# Patient Record
Sex: Male | Born: 1990
Health system: Southern US, Community
[De-identification: ages and names within clinical notes are randomized; demographics above are authoritative.]

## PROBLEM LIST (undated history)

## (undated) DIAGNOSIS — L409 Psoriasis, unspecified: Secondary | ICD-10-CM

## (undated) DIAGNOSIS — E669 Obesity, unspecified: Secondary | ICD-10-CM

## (undated) DIAGNOSIS — R569 Unspecified convulsions: Secondary | ICD-10-CM

## (undated) DIAGNOSIS — F988 Other specified behavioral and emotional disorders with onset usually occurring in childhood and adolescence: Principal | ICD-10-CM

## (undated) HISTORY — DX: Unspecified convulsions: R56.9

## (undated) HISTORY — PX: MASTOIDECTOMY: SUR855

## (undated) HISTORY — PX: ADENOIDECTOMY: SUR15

## (undated) HISTORY — DX: Other specified behavioral and emotional disorders with onset usually occurring in childhood and adolescence: F98.8

## (undated) HISTORY — PX: MYRINGOTOMY WITH TUBE PLACEMENT: SHX5663

## (undated) HISTORY — DX: Psoriasis, unspecified: L40.9

## (undated) HISTORY — DX: Obesity, unspecified: E66.9

---
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
## 2012-07-28 ENCOUNTER — Other Ambulatory Visit: Payer: Self-pay

## 2012-07-28 NOTE — Telephone Encounter (Signed)
Refill request for Concerta 54 mg

## 2012-08-03 ENCOUNTER — Telehealth: Payer: Self-pay

## 2012-08-03 DIAGNOSIS — G40309 Generalized idiopathic epilepsy and epileptic syndromes, not intractable, without status epilepticus: Secondary | ICD-10-CM

## 2012-08-03 DIAGNOSIS — G40209 Localization-related (focal) (partial) symptomatic epilepsy and epileptic syndromes with complex partial seizures, not intractable, without status epilepticus: Secondary | ICD-10-CM

## 2012-08-03 NOTE — Telephone Encounter (Signed)
Paige lvm stating that she is having difficulties getting Keppra BMN filled due to insurance purposes. I called pharmacy to speak with them about exactly what is going on. They said that the woman, Lacretia Nicks, handles this and that she would not be in until tomorrow at 11:00 am. I called mom and let her know that I tried contacting the pharmacy. She said that she is at work and has been trying to contact her insurance all day about this but is unable to stay on hold long enough to find out. She seems to think it's bc the med is BMN and wants to know if they can switch child to generic. The bigger concern is that the child is going to be out of medication tomorrow. I told her that I would have Inetta Fermo give her a call. Please call her at [phone].

## 2012-08-04 NOTE — Telephone Encounter (Signed)
I received a fax from the pharmacy with a form for prior authorization. I completed the form, marked it urgent and faxed it in. I will monitor it today and call them if an answer not received in a timely fashion. TG

## 2012-08-06 NOTE — Telephone Encounter (Signed)
I checked on the prior authorization today, which is still pending at the time of this call. Mom called today left a message today asking about the prior authorization and if he could switch to generic. I left her a message back and asked her to call me to discuss switching him to generic.

## 2012-08-09 MED ORDER — KEPPRA 500 MG PO TABS: ORAL_TABLET | ORAL | Status: DC

## 2012-08-09 NOTE — Telephone Encounter (Signed)
I received a fax today from [employer], the patient's pharmacy insurance, stating that the Keppra was approved to Aug 06, 2013 and that the pharmacy had been notified.

## 2012-08-23 ENCOUNTER — Ambulatory Visit (INDEPENDENT_AMBULATORY_CARE_PROVIDER_SITE_OTHER): Payer: PRIVATE HEALTH INSURANCE | Admitting: Pediatrics

## 2012-08-23 ENCOUNTER — Encounter: Payer: Self-pay | Admitting: Pediatrics

## 2012-08-23 VITALS — BP 132/76 | Wt 257.6 lb

## 2012-08-23 DIAGNOSIS — E669 Obesity, unspecified: Secondary | ICD-10-CM

## 2012-08-23 DIAGNOSIS — F988 Other specified behavioral and emotional disorders with onset usually occurring in childhood and adolescence: Secondary | ICD-10-CM

## 2012-08-23 MED ORDER — METHYLPHENIDATE HCL ER (OSM) 36 MG PO TBCR: 36.0000 mg | EXTENDED_RELEASE_TABLET | ORAL | Status: DC

## 2012-08-24 ENCOUNTER — Encounter: Payer: Self-pay | Admitting: Pediatrics

## 2012-08-24 DIAGNOSIS — L409 Psoriasis, unspecified: Secondary | ICD-10-CM

## 2012-08-24 DIAGNOSIS — E669 Obesity, unspecified: Secondary | ICD-10-CM

## 2012-08-24 DIAGNOSIS — R569 Unspecified convulsions: Secondary | ICD-10-CM

## 2012-08-24 DIAGNOSIS — F988 Other specified behavioral and emotional disorders with onset usually occurring in childhood and adolescence: Secondary | ICD-10-CM

## 2012-08-24 HISTORY — DX: Obesity, unspecified: E66.9

## 2012-08-24 HISTORY — DX: Other specified behavioral and emotional disorders with onset usually occurring in childhood and adolescence: F98.8

## 2012-08-24 HISTORY — DX: Unspecified convulsions: R56.9

## 2012-08-24 HISTORY — DX: Psoriasis, unspecified: L40.9

## 2012-08-24 NOTE — Progress Notes (Signed)
Patient ID: Chase Wheeler, male   DOB: Aug 20, 1990, 22 y.o.   MRN: [id number]  Subjective:     Patient ID: Chase Wheeler, male   DOB: April 13, 1990, 22 y.o.   MRN: [id number]  HPI: 22 y/o M Presents for medication recheck and review.   He is on Concerta 54 mg. He skips it on weekends and days he is off of work. However he has been out for over a month. He reports he cannot focus well off the meds. He has a part time job with [employer]. Symptoms respond well to present medication.  Medication concerns: none.   Remains effective.   He sleeps well at night. Does not know if he snores. His weight is up 5 lbs from 252.4 lbs when last seen in July, with no other SEs. There is a weight of 261 recorded at the Neuro office in Dec.  Labs done Feb 2013 showed high cholesterol of 242 total and 292 TG. Thyroid and A1C were wnl. He is adopted and does not know his biological family medical history. Denies chest pains or other cardiac symptoms. He is followed by Neuro for epilepsy. Last seizure was in May 2012. He has been on Keppra 1500 mg BID for many years.He is due to see them next month and possibly stop or decrease meds. The pt also has Psoriasis. The sun has been helping it. He saw Dermatology over 1 y ago and applies special shampoos. He does not have a f/u appointment with them.   ROS:  Apart from the symptoms reviewed above, there are no other symptoms referable to all systems reviewed.   Physical Examination  Blood pressure 132/76, weight 257 lb 9.6 oz (116.847 kg). General: Alert, NAD, overweight. Appropriate affect. HEENT: TM's - clear, Throat - clear, Neck - FROM, no meningismus, Sclera - clear LYMPH NODES: No LN noted LUNGS: CTA B CV: RRR without Murmurs ABD: Soft, NT, +BS, No HSM GU: Not Examined SKIN: Scalp shows profuse scaling. Elbows with mild erythema and thick patches. NEUROLOGICAL: Grossly intact MUSCULOSKELETAL: Not examined  No results found. No results found for this or any  previous visit (from the past 240 hour(s)). No results found for this or any previous visit (from the past 48 hour(s)).  Assessment:   ADD: off meds for many weeks. His BP is elevated today. There are a few elevated readings of 130s/ 80s recorded randomly over the past visits.  Obesity. Seizure disorder: stable. Psoriasis: uncontrolled.  Plan:   Since he has elevated BP and has been off meds for a while I will restart Concerta at 36 mg only today. The pt will measure BPs at home and let us know the results. May need to start meds at some point. Discussed high cholesterol and healthy weight/ diet. Pt agrees to see a nutritionist. Lab requests for fasting blood levels as below. F/u with Neuro. Make a new appointment with Derm. RTC in 4 m for f/u of meds and weight.  Orders Placed This Encounter  Procedures  . Lipid Panel    Standing Status: Future     Number of Occurrences:      Standing Expiration Date: 08/23/2013    Order Specific Question:  Has the patient fasted?    Answer:  Yes  . Hemoglobin A1c    Standing Status: Future     Number of Occurrences:      Standing Expiration Date: 08/23/2013  . Basic Metabolic Panel    Standing Status: Future  Number of Occurrences:      Standing Expiration Date: 08/23/2013    Order Specific Question:  Has the patient fasted?    Answer:  Yes  . Hepatic Function Panel    Standing Status: Future     Number of Occurrences:      Standing Expiration Date: 08/23/2013  . Ambulatory referral to diabetic education    Referral Priority:  Routine    Referral Type:  Consultation    Referral Reason:  Specialty Services Required    Number of Visits Requested:  1   Current Outpatient Prescriptions  Medication Sig Dispense Refill  . KEPPRA 500 MG tablet Take 3 tabs by mouth twice daily  186 tablet  5  . methylphenidate (CONCERTA) 36 MG CR tablet Take 1 tablet (36 mg total) by mouth every morning.  30 tablet  0   No current facility-administered  medications for this visit.

## 2012-11-12 ENCOUNTER — Other Ambulatory Visit: Payer: Self-pay | Admitting: *Deleted

## 2012-11-12 DIAGNOSIS — F988 Other specified behavioral and emotional disorders with onset usually occurring in childhood and adolescence: Secondary | ICD-10-CM

## 2012-11-12 MED ORDER — METHYLPHENIDATE HCL ER (OSM) 36 MG PO TBCR: 36.0000 mg | EXTENDED_RELEASE_TABLET | ORAL | Status: DC

## 2012-11-24 ENCOUNTER — Telehealth: Payer: Self-pay | Admitting: Pediatrics

## 2012-11-24 NOTE — Telephone Encounter (Signed)
Spoke with mom in office on 8/18 when she came in to pick up Rx for Concerta. I explained to mom that our office is generally moving away from ADHD meds. Also the pt is 22 y/o and has been off meds and on again for many years. He has not had formal testing for ADHD. I offered mom to refer him out for medication management. Mom refused at this time and said she would think about it and let us know. I informed her that I will give him his last Rx today, so she must let us know soon. Mom understands.

## 2013-01-26 ENCOUNTER — Telehealth: Payer: Self-pay | Admitting: *Deleted

## 2013-01-26 NOTE — Telephone Encounter (Signed)
I spoke with mom on Aug 20th and at that time she said she would let us know what she decides. If she wants to stay with [place] then we require formal testing before deciding to continuing ADHD meds here or sending out. If she wants to transfer to another provider, then she must do so soon. Please supply 2 weeks worth of meds till mom makes a decision. He is over 18 now and will be seen by one of our adult providers, if she stays here. Policy is the same.

## 2013-01-26 NOTE — Telephone Encounter (Signed)
Mom called and left VM stating that MD has discussed with her that office is getting away from prescribing ADHD meds. She stated that pt has an upcoming appointment and that he is almost out of meds and asks if MD would give one more refill until he is seen by other MD. She stated that she understood the transition but was hoping that MD would consider giving only one additional refill. Will route to MD.

## 2013-01-27 ENCOUNTER — Other Ambulatory Visit: Payer: Self-pay | Admitting: *Deleted

## 2013-01-27 DIAGNOSIS — F988 Other specified behavioral and emotional disorders with onset usually occurring in childhood and adolescence: Secondary | ICD-10-CM

## 2013-01-27 MED ORDER — METHYLPHENIDATE HCL ER (OSM) 36 MG PO TBCR: 36.0000 mg | EXTENDED_RELEASE_TABLET | ORAL | Status: DC

## 2013-01-27 NOTE — Telephone Encounter (Signed)
Nurse called mom and informed her that pt would be provided with 2 weeks worth of medication. Mom very appreciative and stated that was all they needed.

## 2013-01-27 NOTE — Telephone Encounter (Signed)
Mom notified. See telephone encounter

## 2013-03-16 ENCOUNTER — Ambulatory Visit (INDEPENDENT_AMBULATORY_CARE_PROVIDER_SITE_OTHER): Payer: PRIVATE HEALTH INSURANCE | Admitting: Family

## 2013-03-16 ENCOUNTER — Encounter: Payer: Self-pay | Admitting: Family

## 2013-03-16 VITALS — BP 140/80 | HR 80 | Ht 70.0 in | Wt 245.6 lb

## 2013-03-16 DIAGNOSIS — R569 Unspecified convulsions: Secondary | ICD-10-CM

## 2013-03-16 DIAGNOSIS — F988 Other specified behavioral and emotional disorders with onset usually occurring in childhood and adolescence: Secondary | ICD-10-CM

## 2013-03-16 NOTE — Progress Notes (Signed)
Patient: Chase Wheeler MRN: [id number] Sex: male DOB: 09-Sep-1990  Provider: Elveria Rising, NP Location of Care: [facility]  Note type: Routine return visit  History of Present Illness: Referral Source: Dr. Vivia Ewing History from: patient Chief Complaint: ADD/Epilepsy  Chase Wheeler is a 22 y.o. male with history of epilepsy and attention deficit disorder. He is taking and tolerating Keppra for his seizures. His last seizure occurred Aug 15, 2010 at [facility].  He takes Concerta for problems with attention and focus. 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 He has been healthy since last seen and has lost some weight by intention. He has no concerns today.  Review of Systems: 12 system review was unremarkable  Past Medical History  Diagnosis Date  . Convulsions/seizures 08/24/2012  . Psoriasis 08/24/2012  . ADD (attention deficit disorder) 08/24/2012  . Obesity, unspecified 08/24/2012   Hospitalizations: yes, Head Injury: no, Nervous System Infections: no, Immunizations up to date: yes Past Medical History Comments: Patient was hospitalized June 21 and 22nd of 2009 at [facility] due to seizure activity. He had frequent otitis media as a young child requiring myringotomy tube placements, adenoidectomy and mastoidectomy.   Surgical History Past Surgical History  Procedure Laterality Date  . Tympanoplasty Right 2007    Ruptured right ear drum repair.    Family History Chase Wheeler is adopted. Nothing is known about his birth family's medical history. Family History is negative migraines, seizures, cognitive impairment, blindness, deafness, birth defects, chromosomal disorder, autism.  Social History History   Social History  . Marital Status: Single    Spouse Name: N/A    Number of Children: N/A  . Years of Education: N/A   Social History Main Topics  . Smoking status: Never Smoker   . Smokeless tobacco: Never Used  . Alcohol Use: No  . Drug Use:  No  . Sexual Activity: Not Currently   Other Topics Concern  . None   Social History Narrative  . None   Educational level: graduated 12th grade Occupation: [employer] Living with parents and siblings  Hobbies/Interest: Playing his guitar, football and church production activities. School comments: [employer] graduated from [employer] in 2012.  Current Outpatient Prescriptions on File Prior to Visit  Medication Sig Dispense Refill  . KEPPRA 500 MG tablet Take 3 tabs by mouth twice daily  186 tablet  5  . methylphenidate (CONCERTA) 36 MG CR tablet Take 1 tablet (36 mg total) by mouth every morning.  14 tablet  0   No current facility-administered medications on file prior to visit.   The medication list was reviewed and reconciled. All changes or newly prescribed medications were explained.  A complete medication list was provided to the patient/caregiver.  No Known Allergies  Physical Exam BP 140/80  Pulse 80  Ht 5\' 10"  (1.778 m)  Wt 245 lb 9.6 oz (111.403 kg)  BMI 35.24 kg/m2 General: well developed, well nourished obese young man, seated on exam table, in no evident distress Head: normocephalic and atraumatic. Ears, Nose and Throat:  Oropharynx benign. Neck: supple with no carotid or supraclavicular bruits. Respiratory: lungs clear to auscultation Cardiovascular: regular rate and rhythm, no murmurs. Musculoskeletal: no obvious deformities or scoliosis Skin: no rashes.   Neurologic Exam  Mental Status: Awake and fully alert.  Attention span, concentration, and fund of knowledge appropriate for age.  Speech fluent without dysarthria.  Able to follow commands and participate in examination. Cranial Nerves: Fundoscopic  exam reveals sharp, flat disc margins.  Pupils equal, briskly reactive to light.  Extraocular movements full without nystagmus.  Visual fields full to confrontation.  Hearing intact and symmetric to finger rub.  Facial sensation intact.   Face, tongue, palate move normally and symmetrically.  Neck flexion and extension normal. Motor: Normal bulk and tone.  Normal strength in all tested extremity muscles Sensory: Intact to touch and temperature in all extremities. Coordination: Rapid movements: finger and toe tapping normal and symmetric bilaterally.  Finger-to-nose and heel-to-shin intact bilaterally.  Able to balance on either foot.  Romberg negative. Gait and Station: Arises from chair without difficulty.  Stance is normal.  Gait demonstrates normal stride length and balance.  Able to heel, toe, and tandem walk without difficulty. Reflexes: Diminished and symmetric.  Toes downgoing.  No clonus.  Assessment and Plan Chase Wheeler is a 22 year old young man with history of epilepsy and attention deficit disorder. He is taking and tolerating Keppra for his seizure disorder. His last seizure occurred May, 2012. 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 I talked Chase Wheeler about transition to adult neurology care and he is interested in that. I will arrange for transfer of care to [facility] for his next visit in December 2015.

## 2013-03-16 NOTE — Patient Instructions (Signed)
I am pleased that you have remained seizure free since Aug 15, 2010. Continue to take your Keppra without change. Let me know if you have any breakthrough seizures.  I will arrange for you to transition to adult neurology care at [facility] for your next visit in December 2015. In the interim, please do not hesitate to contact me for any questions or concerns.

## 2013-08-02 ENCOUNTER — Other Ambulatory Visit: Payer: Self-pay | Admitting: Family

## 2013-09-30 ENCOUNTER — Telehealth: Payer: Self-pay

## 2013-09-30 NOTE — Telephone Encounter (Signed)
I called Chase Wheeler and let him know that we are waiting on the PA from the insurance company. He said that he called his refill into the pharmacy a week ago and they did not inform him until today that it needed a PA. Chase Wheeler has enough medication to last until Monday night. I tried calling [name] and reached her vm. I lvm letting her know as well. I will call the family back once we receive the PA.

## 2013-09-30 NOTE — Telephone Encounter (Signed)
Prior Berkley Harveyauth has been sent. Awaiting answer from insurance. TG

## 2013-09-30 NOTE — Telephone Encounter (Signed)
Chase Wheeler, mom , lvm stating that pharmacy told her that [name] needs PA. According to phone note on 07/24/12 PA ran out 08/06/13. [name] can be reached at [phone] or Ben's cell [phone].

## 2013-09-30 NOTE — Telephone Encounter (Signed)
I called insurance company to follow up on PA request. I learned that it was still pending. I will call again on Monday. I also learned that his insurance has his birthday listed as 08/05/1990, which is causing some delay with the processing. Sharlet SalinaBenjamin will need to call and get that corrected. TG

## 2013-10-03 NOTE — Telephone Encounter (Signed)
I called insurance company to follow up on PA for Keppra and learned that it is still pending. TG

## 2013-10-04 NOTE — Telephone Encounter (Addendum)
Called insurance company [phone] to confirm that they have received the fax. The man I spoke with, Gardiner Rhymera, said that the [facility] dept is the one handling it and gave me the phone number. When I called the number, they said they did not have it. I confirmed both fax numbers that I faxed it to. She said not to use the [phone] fax number bc they were having issues with it since last week. Then, she said they have received it, however,  it has not been worked on yet. She said it has "Urgent" written across the top of it, which both Inetta Fermoina and I wrote. The lady, Victorino DikeJennifer, [phone], said it could take 72 hrs.

## 2013-10-04 NOTE — Telephone Encounter (Signed)
I called insurance to check on PA status. They said that they did not receive [facility] 10/03/13. I confirmed fax number with them and they gave me the same fax number that Inetta Fermoina faxed the form to [phone]. I got an alternative fax number [phone]. I explained that pt has been w/o his medication since yesterday and is now at risk of a sz. She said that he could fill with generic, I explained that his szs are well controlled on brand name and that it was imperative to have brand. She said that he could go get a 5 day supply from the pharmacy and pay out of pocket. I faxed it to both the numbers.

## 2013-10-06 NOTE — Telephone Encounter (Signed)
I called mom and she said that she and Chase Wheeler both have been fighting with the insurance to get this approved. Mom said that Chase Wheeler did tell them that his DOB is [birth year] and that they needed to correct the information. I let her know that the PA is good until 10/06/14. She said that Chase Wheeler has been doing excellent on the medication and has not had an issues, he is also working now. Chase Wheeler thanked us for all the hard work.

## 2013-10-06 NOTE — Telephone Encounter (Signed)
I received notice that the PA was approved until October 06, 2014. Please let the patient and the pharmacy know. Please let patient know too that his insurance has his birthday listed as [birth year] and that they said that he would need to correct this birthday with them. Thanks, Inetta Fermoina

## 2013-10-06 NOTE — Telephone Encounter (Signed)
I called and checked on the PA. I was told by Victorino DikeJennifer that the PA was still pending and that it may take until 10/07/13 to process. TG

## 2014-03-17 ENCOUNTER — Encounter: Payer: Self-pay | Admitting: Family

## 2014-03-17 ENCOUNTER — Ambulatory Visit (INDEPENDENT_AMBULATORY_CARE_PROVIDER_SITE_OTHER): Payer: PRIVATE HEALTH INSURANCE | Admitting: Family

## 2014-03-17 VITALS — BP 138/84 | HR 84 | Ht 70.0 in | Wt 237.8 lb

## 2014-03-17 DIAGNOSIS — G40309 Generalized idiopathic epilepsy and epileptic syndromes, not intractable, without status epilepticus: Secondary | ICD-10-CM | POA: Diagnosis not present

## 2014-03-17 NOTE — Progress Notes (Signed)
Patient: Chase Wheeler MRN: [id number] Sex: male DOB: May 11, 1990  Provider: Elveria RisingGOODPASTURE, Chase Aldape, NP Location of Care: [facility]  Note type: Routine return visit  History of Present Illness: Referral Source: Dr. Vivia EwingSteven Halm History from: patient Chief Complaint: ADD/Epilepsy  Chase Wheeler is a 23 y.o. young man with history of convulsive epilepsy and attention deficit disorder. He was last seen March 16, 2013. Chase Wheeler is taking and tolerating Keppra for his seizures. His last seizure occurred Aug 15, 2010 at [facility]. He is not interested in attempting to taper off medication because he needs to be able to drive to work.   Chase Wheeler also takes Concerta for problems with attention and focus. 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 He says that he is enjoying it, and thus far has not had to rotate shifts. He has been healthy since last seen and has lost some weight by intention. He has no concerns today.  Review of Systems: 12 system review was unremarkable  Past Medical History  Diagnosis Date  . Convulsions/seizures 08/24/2012  . Psoriasis 08/24/2012  . ADD (attention deficit disorder) 08/24/2012  . Obesity, unspecified 08/24/2012   Hospitalizations: No., Head Injury: No., Nervous System Infections: No., Immunizations up to date: Yes.   Past Medical History Comments:  Patient was hospitalized June 21 and 22nd of 2009 at [facility] due to seizure activity. He had frequent otitis media as a young child requiring myringotomy tube placements, adenoidectomy and mastoidectomy  Surgical History Past Surgical History  Procedure Laterality Date  . Tympanoplasty Right 2007    Ruptured right ear drum repair.  . Myringotomy with tube placement Bilateral     3 Sets  . Adenoidectomy    . Mastoidectomy      Family History He was adopted. Family history is unknown by patient. Family History is otherwise negative for migraines, seizures, cognitive  impairment, blindness, deafness, birth defects, chromosomal disorder, autism.  Social History History   Social History  . Marital Status: Single    Spouse Name: N/A    Number of Children: N/A  . Years of Education: N/A   Social History Main Topics  . Smoking status: Never Smoker   . Smokeless tobacco: Never Used  . Alcohol Use: 0.0 oz/week    0 Not specified per week  . Drug Use: No  . Sexual Activity: Not Currently   Other Topics Concern  . 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 He graduated from [employer] in 2012.  Physical Exam BP 138/84 mmHg  Pulse 84  Ht 5\' 10"  (1.778 m)  Wt 237 lb 12.8 oz (107.865 kg)  BMI 34.12 kg/m2 General: well developed, well nourished obese young man, seated on exam table, in no evident distress Head: normocephalic and atraumatic. Ears, Nose and Throat: Oropharynx benign. Neck: supple with no carotid or supraclavicular bruits. Respiratory: lungs clear to auscultation Cardiovascular: regular rate and rhythm, no murmurs. Musculoskeletal: no obvious deformities or scoliosis Skin: no rashes.   Neurologic Exam  Mental Status: Awake and fully alert. Attention span, concentration, and fund of knowledge appropriate for age. Speech fluent without dysarthria. Able to follow commands and participate in examination. Cranial Nerves: Fundoscopic exam reveals sharp, flat disc margins. Pupils equal, briskly reactive to light. Extraocular movements full without nystagmus. Visual fields full to  confrontation. Hearing intact and symmetric to finger rub. Facial sensation intact. Face, tongue, palate move normally and symmetrically. Neck flexion and extension normal. Motor: Normal bulk and tone. Normal strength in all tested extremity muscles Sensory: Intact to touch  and temperature in all extremities. Coordination: Rapid movements: finger and toe tapping normal and symmetric bilaterally. Finger-to-nose and heel-to-shin intact bilaterally. Able to balance on either foot. Romberg negative. Gait and Station: Arises from chair without difficulty. Stance is normal. Gait demonstrates normal stride length and balance. Able to heel, toe, and tandem walk without difficulty. Reflexes: Diminished and symmetric. Toes downgoing. No clonus.  Assessment and Plan Chase Wheeler is a 23 year old young man with history of epilepsy and attention deficit disorder. He is taking and tolerating Keppra for his seizure disorder, and has been seizure free since May, 2012. When he was seen last December, we had planned for him to transfer care [facility] this year, but that did not happen for reasons that are unclear to me. However, since he has a diagnosis of epilepsy, I will refer him to Dr Patrcia DollyKaren Aquino at [facility] for transfer of care. He does not need to be seen until December 2016. I will refill his medication and handle any concerns in the interim. Chase Wheeler agrees with this plan.

## 2014-03-17 NOTE — Patient Instructions (Signed)
Continue your Keppra without change. Let me know if you have any seizures or any concerns.   For your next visit, I will arrange transfer of care to Dr. Patrcia DollyKaren Aquino at [facility] for December 2016. I will take care of any refills or other concerns until you are seen by Dr. Karel JarvisAquino next December.

## 2014-05-09 ENCOUNTER — Other Ambulatory Visit: Payer: Self-pay | Admitting: Family

## 2014-11-24 ENCOUNTER — Other Ambulatory Visit: Payer: Self-pay | Admitting: Family

## 2014-12-22 ENCOUNTER — Telehealth: Payer: Self-pay | Admitting: Family

## 2014-12-22 DIAGNOSIS — G40309 Generalized idiopathic epilepsy and epileptic syndromes, not intractable, without status epilepticus: Secondary | ICD-10-CM

## 2014-12-22 NOTE — Telephone Encounter (Signed)
Ben left a message asking me to send in referral to [facility]. I sent the referral as requested. He asked for call back at home at [phone] or mobile at [phone]. I called Ben to let him know that the referral was sent. TG

## 2015-01-08 ENCOUNTER — Other Ambulatory Visit: Payer: Self-pay

## 2015-01-08 MED ORDER — KEPPRA 500 MG PO TABS: ORAL_TABLET | ORAL | Status: DC

## 2015-02-02 ENCOUNTER — Encounter: Payer: Self-pay | Admitting: Neurology

## 2015-02-02 ENCOUNTER — Ambulatory Visit (INDEPENDENT_AMBULATORY_CARE_PROVIDER_SITE_OTHER): Payer: PRIVATE HEALTH INSURANCE | Admitting: Neurology

## 2015-02-02 VITALS — BP 130/90 | HR 68 | Ht 71.0 in | Wt 239.0 lb

## 2015-02-02 DIAGNOSIS — G40009 Localization-related (focal) (partial) idiopathic epilepsy and epileptic syndromes with seizures of localized onset, not intractable, without status epilepticus: Secondary | ICD-10-CM | POA: Diagnosis not present

## 2015-02-02 NOTE — Patient Instructions (Signed)
1. Continue Keppra 500mg : Take 3 tablets twice a day 2. Call our office before your refills run out 3. Follow-up in 1 year, call for any problems  Seizure Precautions: 1. If medication has been prescribed for you to prevent seizures, take it exactly as directed.  Do not stop taking the medicine without talking to your doctor first, even if you have not had a seizure in a long time.   2. Avoid activities in which a seizure would cause danger to yourself or to others.  Don't operate dangerous machinery, swim alone, or climb in high or dangerous places, such as on ladders, roofs, or girders.  Do not drive unless your doctor says you may.  3. If you have any warning that you may have a seizure, lay down in a safe place where you can't hurt yourself.    4.  No driving for 6 months from last seizure, as per [place] state law.   Please refer to the following link on the Epilepsy Foundation of America's website for more information: http://www.epilepsyfoundation.org/answerplace/Social/driving/drivingu.cfm   5.  Maintain good sleep hygiene. Avoid alcohol.  6.  Contact your doctor if you have any problems that may be related to the medicine you are taking.  7.  Call 911 and bring the patient back to the ED if:        A.  The seizure lasts longer than 5 minutes.       B.  The patient doesn't awaken shortly after the seizure  C.  The patient has new problems such as difficulty seeing, speaking or moving  D.  The patient was injured during the seizure  E.  The patient has a temperature over 102 F (39C)  F.  The patient vomited and now is having trouble breathing

## 2015-02-02 NOTE — Progress Notes (Addendum)
NEUROLOGY CONSULTATION NOTE  HAVARD RADIGAN MRN: [id number] DOB: 03-24-1991  Referring provider: Elveria Rising, NP Primary care provider: Dr. Fara Chute  Reason for consult:  Establish adult care for seizures  Thank you for your kind referral of Chase Wheeler for consultation of the above symptoms. Although his history is well known to you, please allow me to reiterate it for the purpose of our medical record. Records and images were personally reviewed where available.  HISTORY OF PRESENT ILLNESS: This is a very pleasant 24 year old right-handed man with a history of seizures since age 33 and ADD, presenting to establish adult care for his epilepsy. He states his last convulsion was in May 2012. He sometimes has an aura where he feels the seizure coming on, and could feel himself looking off to the left side, before progressing to a GTC. He recalls having episodes where he would feel himself looking to the left side, without going into a convulsion. Seizures usually occurred very early in the morning or as soon as he was waking up. He denies any post-ictal focal weakness, but usually has a headache, nausea, cannot taste anything, and a cramp in the right calf after a seizure. He reports having nocturnal seizures in the past, where he would wake up with these symptoms. Sleep deprivation may be a trigger to some of his seizures. He was brought to [facility] for the seizures, per patient he had brain imaging and an EEG, results unavailable for review. He reports being tried on unrecalled medications which did not help, until he was started on brand name Keppra, which has controlled his seizures for the past 3 years. He denies any side effects on Keppra  BID. He denies any further staring/unresponsive episodes, gaps in time, olfactory/gustatory hallucinations, deja vu, rising epigastric sensation, focal numbness/tingling/weakness, myoclonic jerks. He denies any headaches,  dizziness, diplopia, dysarthria, dysphagia, neck/back pain, bowel/bladder dysfunction. He feels mood is pretty good. His memory is pretty good with certain things. He is currently looking for a new job. He is driving. He drinks alcohol occasionally.  Epilepsy Risk Factors:  He had a normal birth and early development.  There is no history of febrile convulsions, CNS infections such as meningitis/encephalitis, significant traumatic brain injury, neurosurgical procedures, or family history of seizures.  PAST MEDICAL HISTORY: Past Medical History  Diagnosis Date  . Convulsions/seizures (HCC) 08/24/2012  . Psoriasis 08/24/2012  . ADD (attention deficit disorder) 08/24/2012  . Obesity, unspecified 08/24/2012    PAST SURGICAL HISTORY: Past Surgical History  Procedure Laterality Date  . Tympanoplasty Right 2007    Ruptured right ear drum repair.  . Myringotomy with tube placement Bilateral     3 Sets  . Adenoidectomy    . Mastoidectomy      MEDICATIONS: Current Outpatient Prescriptions on File Prior to Visit  Medication Sig Dispense Refill  . KEPPRA 500 MG tablet TAKE 3 TABLETS BY MOUTH TWICE DAILY. 180 tablet 0  . methylphenidate 54 MG PO CR tablet Take 1 tablet each morning     No current facility-administered medications on file prior to visit.    ALLERGIES: No Known Allergies  FAMILY HISTORY: Family History  Problem Relation Age of Onset  . Adopted: Yes  . Family history unknown: Yes    SOCIAL HISTORY: Social History   Social History  . Marital Status: Single    Spouse Name: N/A  . Number of Children: N/A  . Years of Education: N/A   Occupational History  .  Unemployed    Social History Main Topics  . Smoking status: Never Smoker   . Smokeless tobacco: Never Used  . Alcohol Use: 0.0 oz/week    0 Standard drinks or equivalent per week     Comment: occ  . Drug Use: No  . Sexual Activity: Not Currently   Other Topics Concern  . Not on file   Social History  Narrative    REVIEW OF SYSTEMS: Constitutional: No fevers, chills, or sweats, no generalized fatigue, change in appetite Eyes: No visual changes, double vision, eye pain Ear, nose and throat: No hearing loss, ear pain, nasal congestion, sore throat Cardiovascular: No chest pain, palpitations Respiratory:  No shortness of breath at rest or with exertion, wheezes GastrointestinaI: No nausea, vomiting, diarrhea, abdominal pain, fecal incontinence Genitourinary:  No dysuria, urinary retention or frequency Musculoskeletal:  No neck pain, back pain Integumentary: No rash, pruritus, skin lesions Neurological: as above Psychiatric: No depression, insomnia, anxiety Endocrine: No palpitations, fatigue, diaphoresis, mood swings, change in appetite, change in weight, increased thirst Hematologic/Lymphatic:  No anemia, purpura, petechiae. Allergic/Immunologic: no itchy/runny eyes, nasal congestion, recent allergic reactions, rashes  PHYSICAL EXAM: Filed Vitals:   02/02/15 1022  BP: 130/90  Pulse: 68   General: No acute distress Head:  Normocephalic/atraumatic Eyes: Fundoscopic exam shows bilateral sharp discs, no vessel changes, exudates, or hemorrhages Neck: supple, no paraspinal tenderness, full range of motion Back: No paraspinal tenderness Heart: regular rate and rhythm Lungs: Clear to auscultation bilaterally. Vascular: No carotid bruits. Skin/Extremities: No rash, no edema Neurological Exam: Mental status: alert and oriented to person, place, and time, no dysarthria or aphasia, Fund of knowledge is appropriate.  Recent and remote memory are intact. 3/3 delayed recall.  Attention and concentration are normal.    Able to name objects and repeat phrases. Cranial nerves: CN I: not tested CN II: pupils equal, round and reactive to light, visual fields intact, fundi unremarkable. CN III, IV, VI:  full range of motion, no nystagmus, no ptosis CN V: facial sensation intact CN VII: upper and  lower face symmetric CN VIII: hearing intact to finger rub CN IX, X: gag intact, uvula midline CN XI: sternocleidomastoid and trapezius muscles intact CN XII: tongue midline Bulk & Tone: normal, no fasciculations. Motor: 5/5 throughout with no pronator drift. Sensation: intact to light touch, cold, pin, vibration and joint position sense.  No extinction to double simultaneous stimulation.  Romberg test negative Deep Tendon Reflexes: +2 throughout, no ankle clonus Plantar responses: downgoing bilaterally Cerebellar: no incoordination on finger to nose, heel to shin. No dysdiadochokinesia Gait: narrow-based and steady, able to tandem walk adequately. 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 He describes episodes where he can feel himself staring and looking to the left, these may or may not progress to a convulsion. Prior EEG/MRI unavailable for review and will be requested from his previous providers. He denies any seizures since May 2012 on Keppra  BID. He is interested in switching to generic Levetiracetam, but is unsure if he had problems with generic in the past, as he has been on brand for many years. We discussed avoidance of seizure triggers, including missing medication, alcohol, and sleep deprivation. At this point, I would recommend continuation of medication. We did discuss that he has been seizure-free for more than 3 years, and the option of potentially tapering down  medication, but would recommend doing an EEG first before medication changes. He would like to stay on medication at this time as well. [place] driving laws were discussed with the patient, and he knows to stop driving after a seizure, until 6 months seizure-free. He will call our office once he needs refills. He will follow-up in 1 year and knows to call our office  for any changes.   Thank you for allowing me to participate in the care of this patient. Please do not hesitate to call for any questions or concerns.   Patrcia DollyKaren Kina Shiffman, M.D.  CC: Dr. Neita CarpSasser, Elveria Risingina Goodpasture   ADDENDUM 04/25/15: Records from his pediatric neurologist were reviewed. Per notes, he had testing at [facility]. MRI brain 09/27/2007 normal. EEG 09/27/2007 showed right paracentral sharp waves which were exacerbated by hyperventilation. He had jerking of his left leg with no EEG changes.

## 2015-06-21 ENCOUNTER — Encounter: Payer: Self-pay | Admitting: Family

## 2015-10-26 ENCOUNTER — Other Ambulatory Visit: Payer: Self-pay | Admitting: Family

## 2015-10-29 ENCOUNTER — Other Ambulatory Visit: Payer: Self-pay | Admitting: Pediatrics

## 2015-10-30 ENCOUNTER — Other Ambulatory Visit: Payer: Self-pay | Admitting: Family

## 2015-10-31 ENCOUNTER — Telehealth: Payer: Self-pay | Admitting: Neurology

## 2015-10-31 MED ORDER — KEPPRA 500 MG PO TABS: ORAL_TABLET | ORAL | 5 refills | Status: DC

## 2015-10-31 NOTE — Telephone Encounter (Signed)
Keppra refill requested. Per last office note- patient to remain on medication. Refill approved and sent to patient's pharmacy. Patient made aware.

## 2015-11-02 ENCOUNTER — Telehealth: Payer: Self-pay | Admitting: Neurology

## 2015-11-02 NOTE — Telephone Encounter (Signed)
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 We can fill out DMV forms so that Medical Advisory board can give him determination. Thanks

## 2015-11-02 NOTE — Telephone Encounter (Signed)
Patient made aware. He will contact DMV and bring by paperwork to be completed if need be.

## 2015-11-02 NOTE — Telephone Encounter (Signed)
Looks like over 3 years seizure free. Is this something you would do or would patient need to see MD specifically for evaluating for commercial licensing?

## 2015-11-05 ENCOUNTER — Telehealth: Payer: Self-pay | Admitting: Neurology

## 2015-11-05 NOTE — Telephone Encounter (Signed)
Called to see if we had obtained prior authorization. We have completed but not yet heard back from insurance. Awaiting decision.

## 2016-02-04 ENCOUNTER — Encounter: Payer: Self-pay | Admitting: Neurology

## 2016-02-04 ENCOUNTER — Ambulatory Visit (INDEPENDENT_AMBULATORY_CARE_PROVIDER_SITE_OTHER): Payer: 59 | Admitting: Neurology

## 2016-02-04 VITALS — BP 128/76 | HR 84 | Ht 71.0 in | Wt 244.1 lb

## 2016-02-04 DIAGNOSIS — G40009 Localization-related (focal) (partial) idiopathic epilepsy and epileptic syndromes with seizures of localized onset, not intractable, without status epilepticus: Secondary | ICD-10-CM | POA: Diagnosis not present

## 2016-02-04 MED ORDER — KEPPRA 500 MG PO TABS: ORAL_TABLET | ORAL | 3 refills | Status: DC

## 2016-02-04 NOTE — Progress Notes (Signed)
NEUROLOGY FOLLOW UP OFFICE NOTE  Dineen KidBenjamin W Smedley [id number]  HISTORY OF PRESENT ILLNESS: I had the pleasure of seeing Chase Wheeler in follow-up in the neurology clinic on 02/04/2016.  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 He has been seizure-free since 2012 on Keppra 1500mg  BID with no side effects. He denies any aura where he feels himself looking to the left side, no staring/unresponsive episodes, gaps in time, olfactory/gustatory hallucinations, deja vu, rising epigastric sensation, focal numbness/tingling/weakness, myoclonic jerks. He denies any headaches, dizziness, diplopia, dysarthria, dysphagia, neck/back pain, bowel/bladder dysfunction. He works Insurance claims handlerrunning heavy equipment. He is back to driving.   HPI 02/02/2015: This is a very pleasant 25 yo RH man with a history of seizures since age 25 and ADD. His last convulsion was in May 2012. He sometimes has an aura where he feels the seizure coming on, and could feel himself looking off to the left side, before progressing to a GTC. He recalls having episodes where he would feel himself looking to the left side, without going into a convulsion. Seizures usually occurred very early in the morning or as soon as he was waking up. He denies any post-ictal focal weakness, but usually has a headache, nausea, cannot taste anything, and a cramp in the right calf after a seizure. He reports having nocturnal seizures in the past, where he would wake up with these symptoms. Sleep deprivation may be a trigger to some of his seizures. He was [facility] to [facility] seizures, per patient he had brain imaging and an EEG, results unavailable for review. He reports being tried on unrecalled medications which did not help, until he was started on brand name Keppra, which has controlled his seizures for the past 3 years. He denies any side effects on Keppra 1500mg  BID. He denies any further staring/unresponsive  episodes, gaps in time, olfactory/gustatory hallucinations, deja vu, rising epigastric sensation, focal numbness/tingling/weakness, myoclonic jerks. He denies any headaches, dizziness, diplopia, dysarthria, dysphagia, neck/back pain, bowel/bladder dysfunction. He feels mood is pretty good. His memory is pretty good with certain things. He is driving. He drinks alcohol occasionally.  Epilepsy Risk Factors:  He had a normal birth and early development.  There is no history of febrile convulsions, CNS infections such as meningitis/encephalitis, significant traumatic brain injury, neurosurgical procedures, or family history of seizures.  PAST MEDICAL HISTORY: Past Medical History:  Diagnosis Date  . ADD (attention deficit disorder) 08/24/2012  . Convulsions/seizures (HCC) 08/24/2012  . Obesity, unspecified 08/24/2012  . Psoriasis 08/24/2012    MEDICATIONS: Current Outpatient Prescriptions on File Prior to Visit  Medication Sig Dispense Refill  . KEPPRA 500 MG tablet TAKE 3 TABLETS BY MOUTH TWICE DAILY. 180 tablet 5   No current facility-administered medications on file prior to visit.     ALLERGIES: No Known Allergies  FAMILY HISTORY: Family History  Problem Relation Age of Onset  . Adopted: Yes  . Family history unknown: Yes    SOCIAL HISTORY: Social History   Social History  . Marital status: Single    Spouse name: N/A  . Number of children: N/A  . Years of education: N/A   Occupational History  . Unemployed    Social History Main Topics  . Smoking status: Never Smoker  . Smokeless tobacco: Never Used  . Alcohol use 0.0 oz/week     Comment: occ  . Drug use: No  . Sexual activity: Not Currently   Other Topics Concern  .  Not on file   Social History Narrative  . No narrative on file    REVIEW OF SYSTEMS: Constitutional: No fevers, chills, or sweats, no generalized fatigue, change in appetite Eyes: No visual changes, double vision, eye pain Ear, nose and throat:  No hearing loss, ear pain, nasal congestion, sore throat Cardiovascular: No chest pain, palpitations Respiratory:  No shortness of breath at rest or with exertion, wheezes GastrointestinaI: No nausea, vomiting, diarrhea, abdominal pain, fecal incontinence Genitourinary:  No dysuria, urinary retention or frequency Musculoskeletal:  No neck pain, back pain Integumentary: No rash, pruritus, skin lesions Neurological: as above Psychiatric: No depression, insomnia, anxiety Endocrine: No palpitations, fatigue, diaphoresis, mood swings, change in appetite, change in weight, increased thirst Hematologic/Lymphatic:  No anemia, purpura, petechiae. Allergic/Immunologic: no itchy/runny eyes, nasal congestion, recent allergic reactions, rashes  PHYSICAL EXAM: Vitals:   02/04/16 0827  BP: 128/76  Pulse: 84   General: No acute distress Head:  Normocephalic/atraumatic Neck: supple, no paraspinal tenderness, full range of motion Heart:  Regular rate and rhythm Lungs:  Clear to auscultation bilaterally Back: No paraspinal tenderness Skin/Extremities: No rash, no edema Neurological Exam: alert and oriented to person, place, and time. No aphasia or dysarthria. Fund of knowledge is appropriate.  Recent and remote memory are intact. 3/3 delayed recall.  Attention and concentration are normal.    Able to name objects and repeat phrases. Cranial nerves: Pupils equal, round, reactive to light.  Extraocular movements intact with no nystagmus. Visual fields full. Facial sensation intact. No facial asymmetry. Tongue, uvula, palate midline.  Motor: Bulk and tone normal, muscle strength 5/5 throughout with no pronator drift.  Sensation to light touch intact.  No extinction to double simultaneous stimulation.  Deep tendon reflexes 2+ throughout, toes downgoing.  Finger to nose testing intact.  Gait narrow-based and steady, able to tandem walk adequately.  Romberg negative.  IMPRESSION: This is a pleasant 25 yo RH man  with a history of seizures since age 25 suggestive of focal to bilateral tonic-clonic epilepsy possibly arising from the right hemisphere. He describes episodes where he can feel himself staring and looking to the left, these may or may not progress to a convulsion. Prior EEG/MRI unavailable for review. He denies any seizures since May 2012 on Keppra 1500mg  BID, no side effects. He is aware of [place] driving laws to stop driving after a seizure, until 6 months seizure-free. He will call our office once he needs refills. He will follow-up in 1 year and knows to call our office for any changes.   Thank you for allowing me to participate in his care.  Please do not hesitate to call for any questions or concerns.  The duration of this appointment visit was 15 minutes of face-to-face time with the patient.  Greater than 50% of this time was spent in counseling, explanation of diagnosis, planning of further management, and coordination of care.   Patrcia DollyKaren Verba Ainley, M.D.   CC: Dr. Neita CarpSasser

## 2016-02-04 NOTE — Patient Instructions (Signed)
1. Continue Keppra 500mg : Take 3 tablets twice a day 2. 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 Follow-up in 1 year, call for any changes  Seizure Precautions: 1. If medication has been prescribed for you to prevent seizures, take it exactly as directed.  Do not stop taking the medicine without talking to your doctor first, even if you have not had a seizure in a long time.   2. Avoid activities in which a seizure would cause danger to yourself or to others.  Don't operate dangerous machinery, swim alone, or climb in high or dangerous places, such as on ladders, roofs, or girders.  Do not drive unless your doctor says you may.  3. If you have any warning that you may have a seizure, lay down in a safe place where you can't hurt yourself.    4.  No driving for 6 months from last seizure, as per [facility] law.   Please refer to the following link on the Epilepsy Foundation of America's website for more information: http://www.epilepsyfoundation.org/answerplace/Social/driving/drivingu.cfm   5.  Maintain good sleep hygiene. Avoid alcohol.  6.  Contact your doctor if you have any problems that may be related to the medicine you are taking.  7.  Call 911 and bring the patient back to the ED if:        A.  The seizure lasts longer than 5 minutes.       B.  The patient doesn't awaken shortly after the seizure  C.  The patient has new problems such as difficulty seeing, speaking or moving  D.  The patient was injured during the seizure  E.  The patient has a temperature over 102 F (39C)  F.  The patient vomited and now is having trouble breathing

## 2016-03-12 ENCOUNTER — Telehealth: Payer: Self-pay

## 2016-03-12 NOTE — Telephone Encounter (Signed)
Patient notified

## 2016-03-12 NOTE — Telephone Encounter (Signed)
-----   Message from Van ClinesKaren M Aquino, MD sent at 03/12/2016 10:58 AM EST ----- Regarding: keppra Pls ask him if he is having any difficulties getting his medication, we got a note from [employer] that it is not covered by his health plan. 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 Thanks

## 2016-07-18 ENCOUNTER — Telehealth: Payer: Self-pay

## 2016-07-18 NOTE — Telephone Encounter (Signed)
Received via fax  Prior auth for Brand Keppra 500 mg  From [employer].  Will forward to provider for completion. 7-10 day turnaround.

## 2016-08-06 ENCOUNTER — Telehealth: Payer: Self-pay

## 2016-08-06 NOTE — Telephone Encounter (Signed)
-----   Message from Van Clines, MD sent at 08/06/2016  8:43 AM EDT ----- Regarding: pls check with patient Can you pls call patient and ask if he is having difficulties getting his medication Keppra? We keep getting letters from [employer]. Thanks

## 2016-08-06 NOTE — Telephone Encounter (Signed)
Called pt.  He states that he still has a months worth of Keppra on hand.  I apologized for the hold up on these prior authorization issues.    **It does look like everything went through on our end this morning - waiting for confirmation**

## 2016-08-11 ENCOUNTER — Telehealth: Payer: Self-pay | Admitting: Neurology

## 2016-08-11 NOTE — Telephone Encounter (Signed)
Caller:  Patient's mother [name]  Urgent? No  Reason for the call: She said her son is working out of town and they were wanting to follow up on the status of the Prior Authorization on his medication. She said he uses [facility]. She was also enquiring about [employer]? Please call his Mom. Thanks

## 2016-08-12 NOTE — Telephone Encounter (Signed)
LMOM asking pt mother to call office - please relay message below  **It appears that everything has gone through for the prior auth on his medication.  We have not received anything further stating that it hasn't**

## 2016-08-15 ENCOUNTER — Telehealth: Payer: Self-pay | Admitting: Neurology

## 2016-08-15 ENCOUNTER — Other Ambulatory Visit: Payer: Self-pay

## 2016-08-15 NOTE — Telephone Encounter (Signed)
PT called and said he is having problems getting his medication of Keppra refilled at [facility] in [place]

## 2016-08-15 NOTE — Telephone Encounter (Signed)
Called [employer] [facility], had the prior authorization go through.  Called Rx into [facility] per pt request.  Called pt to give him the update, he was very appreciative.

## 2016-10-20 ENCOUNTER — Telehealth: Payer: Self-pay | Admitting: Neurology

## 2016-10-20 ENCOUNTER — Other Ambulatory Visit: Payer: Self-pay

## 2016-10-20 DIAGNOSIS — G40009 Localization-related (focal) (partial) idiopathic epilepsy and epileptic syndromes with seizures of localized onset, not intractable, without status epilepticus: Secondary | ICD-10-CM

## 2016-10-20 MED ORDER — KEPPRA 500 MG PO TABS: ORAL_TABLET | ORAL | 3 refills | Status: DC

## 2016-10-20 NOTE — Telephone Encounter (Signed)
PT wants to know if his prescription for Keppra has been called in, he is almost out

## 2016-10-20 NOTE — Telephone Encounter (Signed)
Rx sent to [facility].  Pt notified.

## 2016-10-24 ENCOUNTER — Telehealth: Payer: Self-pay | Admitting: Neurology

## 2016-10-24 NOTE — Telephone Encounter (Signed)
Called patients mother back, she stated her son has enough Keppra until next Thursday..... Explained to her that urgent appeal was done a few days ago and we are waiting on response.  She agreed we send in 30 pills to last him through next weekend until they can hear back from the insurance  Spoke with pharmacy and they are filling a rx for 30 pills

## 2016-10-24 NOTE — Telephone Encounter (Signed)
Caller: Delma Postaige Shepherd (mother)  Urgent? No   Reason for the call: Regarding his Keppra medication and it being pre approved. I believe [employer]agan had been working on it. Please call. Thanks

## 2016-10-24 NOTE — Telephone Encounter (Signed)
We faxed an urgent appeal a few days ago, it may be in [employer] files. If he needs meds before we get the authorization, [facility] few days [facility]. How much more does he have? Thanks

## 2016-10-24 NOTE — Telephone Encounter (Signed)
Per patients mother patient is almost out of medications and is in [place], she has to mail her son the medications....    She stated [employer] had been working on this prior approval and she has not heard back.... Dr Karel JarvisAquino do you know anything about having to do a Peer to Peer review for this patient for his Keppra?  I can not seem to find any documentation about a prior auth

## 2016-10-30 ENCOUNTER — Telehealth: Payer: Self-pay

## 2016-10-30 ENCOUNTER — Other Ambulatory Visit: Payer: Self-pay

## 2016-10-30 MED ORDER — LEVETIRACETAM 500 MG PO TABS: 1500.0000 mg | ORAL_TABLET | Freq: Three times a day (TID) | ORAL | 3 refills | Status: DC

## 2016-10-30 NOTE — Telephone Encounter (Signed)
Prior auth and appeal were both denied.  Customer service rep could not give me a reason other than the appeals department denied authorization.

## 2016-10-30 NOTE — Telephone Encounter (Signed)
pls let mother know about denial. Pls send Rx for generic Keppra, thanks

## 2016-10-30 NOTE — Telephone Encounter (Signed)
Can you check with that fax we sent for Urgent Appeal for the Keppra?

## 2016-10-30 NOTE — Telephone Encounter (Signed)
Received after hours voicemail from pt's mother, Chase Wheeler, stating that they still have not heard anything about the PA that was started last week for [name].  She paid out of pocket for 5 days worth, which will be just enough to last pt until next Tuesday.  She is now wanting to know if there is a generic alternative?  She says that the headache of going through prior auth's and the stress of possible denials aren't "doign them any favors" and she knows that pt cannot afford to pay cash for the Keppra.  [place] is working in [place] right now, and she will need to Rx sent in time so that she can ship meds to pt.  Please advise.

## 2016-10-31 NOTE — Telephone Encounter (Signed)
Rx for generic Keppra sent to [facility] on 7/26.  Spoke with pt's mother, [name], this morning letting her know that I sent Rx yesterday.  Was waiting to see if I had any kick-back before telling her Rx was sent.  She states that with pt doing so much traveling for work that she will continue to pay cash for name brand Keppra until pt comes home for a few days, that way she can watch for any issues he may have switching from name brand to generic.

## 2017-02-03 ENCOUNTER — Encounter: Payer: Self-pay | Admitting: Neurology

## 2017-02-03 ENCOUNTER — Ambulatory Visit (INDEPENDENT_AMBULATORY_CARE_PROVIDER_SITE_OTHER): Payer: 59 | Admitting: Neurology

## 2017-02-03 VITALS — BP 130/90 | HR 86 | Ht 71.0 in | Wt 247.2 lb

## 2017-02-03 DIAGNOSIS — G40009 Localization-related (focal) (partial) idiopathic epilepsy and epileptic syndromes with seizures of localized onset, not intractable, without status epilepticus: Secondary | ICD-10-CM

## 2017-02-03 MED ORDER — LEVETIRACETAM 500 MG PO TABS: ORAL_TABLET | ORAL | 3 refills | Status: DC

## 2017-02-03 NOTE — Patient Instructions (Signed)
1. Continue taking the Keppra 500mg : 3 tabs in AM, 3 tabs in PM.  2. For the mid-day dose, take 2 tablets for 2 days, 1 tablet for 2 days, then stop the mid-day dose 3. Follow-up in 1 year, call for any changes  Seizure Precautions: 1. If medication has been prescribed for you to prevent seizures, take it exactly as directed.  Do not stop taking the medicine without talking to your doctor first, even if you have not had a seizure in a long time.   2. Avoid activities in which a seizure would cause danger to yourself or to others.  Don't operate dangerous machinery, swim alone, or climb in high or dangerous places, such as on ladders, roofs, or girders.  Do not drive unless your doctor says you may.  3. If you have any warning that you may have a seizure, lay down in a safe place where you can't hurt yourself.    4.  No driving for 6 months from last seizure, as per [facility] state law.   Please refer to the following link on the Epilepsy Foundation of America's website for more information: http://www.epilepsyfoundation.org/answerplace/Social/driving/drivingu.cfm   5.  Maintain good sleep hygiene. Avoid alcohol.  6.  Contact your doctor if you have any problems that may be related to the medicine you are taking.  7.  Call 911 and bring the patient back to the ED if:        A.  The seizure lasts longer than 5 minutes.       B.  The patient doesn't awaken shortly after the seizure  C.  The patient has new problems such as difficulty seeing, speaking or moving  D.  The patient was injured during the seizure  E.  The patient has a temperature over 102 F (39C)  F.  The patient vomited and now is having trouble breathing

## 2017-02-03 NOTE — Progress Notes (Signed)
NEUROLOGY FOLLOW UP OFFICE NOTE  Chase Wheeler [id number]  HISTORY OF PRESENT ILLNESS: I had the pleasure of seeing Chase Wheeler in follow-up in the neurology clinic on 02/04/2016.  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 He has been seizure-free since 2012. He had been taking brand Keppra 1500mg  BID, however due to insurance changes, it was insisted he go on generic despite multiple appeals. It appears there was some miscommunication and he has been taking Levetiracetam 1500mg  TID for the past 3 months. He reports difficulties occasionally remembering to take the mid-day dose. He denies any side effects on medication. He denies any aura where he feels himself looking to the left side, no staring/unresponsive episodes, gaps in time, olfactory/gustatory hallucinations, deja vu, rising epigastric sensation, focal numbness/tingling/weakness, myoclonic jerks. He denies any headaches, dizziness, diplopia, dysarthria, dysphagia, neck/back pain, bowel/bladder dysfunction. 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   HPI 02/02/2015: This is a very pleasant 26 yo RH man with a history of seizures since age 26 and ADD. His last convulsion was in May 2012. He sometimes has an aura where he feels the seizure coming on, and could feel himself looking off to the left side, before progressing to a GTC. He recalls having episodes where he would feel himself looking to the left side, without going into a convulsion. Seizures usually occurred very early in the morning or as soon as he was waking up. He denies any post-ictal focal weakness, but usually has a headache, nausea, cannot taste anything, and a cramp in the right calf after a seizure. He reports having nocturnal seizures in the past, where he would wake up with these symptoms. Sleep deprivation may be a trigger to some of his seizures. [facility], per  patient he had brain imaging and an EEG, results unavailable for review. He reports being tried on unrecalled medications which did not help, until he was started on brand name Keppra, which has controlled his seizures for the past 3 years. He denies any side effects on Keppra 1500mg  BID. He denies any further staring/unresponsive episodes, gaps in time, olfactory/gustatory hallucinations, deja vu, rising epigastric sensation, focal numbness/tingling/weakness, myoclonic jerks. He denies any headaches, dizziness, diplopia, dysarthria, dysphagia, neck/back pain, bowel/bladder dysfunction. He feels mood is pretty good. His memory is pretty good with certain things. He is driving. He drinks alcohol occasionally.  Epilepsy Risk Factors:  He had a normal birth and early development.  There is no history of febrile convulsions, CNS infections such as meningitis/encephalitis, significant traumatic brain injury, neurosurgical procedures, or family history of seizures.  PAST MEDICAL HISTORY: Past Medical History:  Diagnosis Date  . ADD (attention deficit disorder) 08/24/2012  . Convulsions/seizures (HCC) 08/24/2012  . Obesity, unspecified 08/24/2012  . Psoriasis 08/24/2012    MEDICATIONS: Current Outpatient Prescriptions on File Prior to Visit  Medication Sig Dispense Refill  . levETIRAcetam (KEPPRA) 500 MG tablet Take 3 tablets (1,500 mg total) by mouth 3 (three) times daily. 810 tablet 3  . methylphenidate 54 MG PO CR tablet Take 54 mg by mouth.     No current facility-administered medications on file prior to visit.     ALLERGIES: No Known Allergies  FAMILY HISTORY: Family History  Problem Relation Age of Onset  . Adopted: Yes  . Family history unknown: Yes    SOCIAL HISTORY: Social History   Social History  . Marital status: Single  Spouse name: N/A  . Number of children: N/A  . Years of education: N/A   Occupational History  . Unemployed    Social History Main Topics  .  Smoking status: Never Smoker  . Smokeless tobacco: Never Used  . Alcohol use 0.0 oz/week     Comment: occ  . Drug use: No  . Sexual activity: Not Currently   Other Topics Concern  . Not on file   Social History Narrative  . No narrative on file    REVIEW OF SYSTEMS: Constitutional: No fevers, chills, or sweats, no generalized fatigue, change in appetite Eyes: No visual changes, double vision, eye pain Ear, nose and throat: No hearing loss, ear pain, nasal congestion, sore throat Cardiovascular: No chest pain, palpitations Respiratory:  No shortness of breath at rest or with exertion, wheezes GastrointestinaI: No nausea, vomiting, diarrhea, abdominal pain, fecal incontinence Genitourinary:  No dysuria, urinary retention or frequency Musculoskeletal:  No neck pain, back pain Integumentary: No rash, pruritus, skin lesions Neurological: as above Psychiatric: No depression, insomnia, anxiety Endocrine: No palpitations, fatigue, diaphoresis, mood swings, change in appetite, change in weight, increased thirst Hematologic/Lymphatic:  No anemia, purpura, petechiae. Allergic/Immunologic: no itchy/runny eyes, nasal congestion, recent allergic reactions, rashes  PHYSICAL EXAM: Vitals:   02/03/17 0959  BP: 130/90  Pulse: 86  SpO2: 98%   General: No acute distress Head:  Normocephalic/atraumatic Neck: supple, no paraspinal tenderness, full range of motion Heart:  Regular rate and rhythm Lungs:  Clear to auscultation bilaterally Back: No paraspinal tenderness Skin/Extremities: No rash, no edema Neurological Exam: alert and oriented to person, place, and time. No aphasia or dysarthria. Fund of knowledge is appropriate.  Recent and remote memory are intact. 3/3 delayed recall.  Attention and concentration are normal.    Able to name objects and repeat phrases. Cranial nerves: Pupils equal, round, reactive to light.  Extraocular movements intact with no nystagmus. Visual fields full. Facial  sensation intact. No facial asymmetry. Tongue, uvula, palate midline.  Motor: Bulk and tone normal, muscle strength 5/5 throughout with no pronator drift.  Sensation to light touch intact.  No extinction to double simultaneous stimulation.  Deep tendon reflexes 2+ throughout, toes downgoing.  Finger to nose testing intact.  Gait narrow-based and steady, able to tandem walk adequately.  Romberg negative.  IMPRESSION: This is a pleasant 26 yo RH man with a history of seizures since age 74 suggestive of focal to bilateral tonic-clonic epilepsy possibly arising from the right hemisphere. He describes episodes where he can feel himself staring and looking to the left, these may or may not progress to a convulsion. Prior EEG/MRI unavailable for review. He denies any seizures since May 2012 on Keppra 1500mg  BID, no side effects. There was some miscommunication and he has been taking Levetiracetam 1500mg  TID instead of BID, he will start tapering down the mid-day dose every 2 days so he is back to original dosing of Levetiracetam 1500mg  BID. He is aware of [place] driving laws to stop driving after a seizure, until 6 months seizure-free. He will follow-up in 1 year and knows to call our office for any changes.   Thank you for allowing me to participate in his care.  Please do not hesitate to call for any questions or concerns.  The duration of this appointment visit was 15 minutes of face-to-face time with the patient.  Greater than 50% of this time was spent in counseling, explanation of diagnosis, planning of further management, and coordination of care.  [name], M.D.   CC: Dr. Neita Carp

## 2018-02-03 ENCOUNTER — Ambulatory Visit (INDEPENDENT_AMBULATORY_CARE_PROVIDER_SITE_OTHER): Payer: 59 | Admitting: Neurology

## 2018-02-03 ENCOUNTER — Encounter: Payer: Self-pay | Admitting: Neurology

## 2018-02-03 ENCOUNTER — Other Ambulatory Visit: Payer: Self-pay

## 2018-02-03 VITALS — BP 110/84 | HR 96 | Ht 72.0 in | Wt 267.0 lb

## 2018-02-03 DIAGNOSIS — G40009 Localization-related (focal) (partial) idiopathic epilepsy and epileptic syndromes with seizures of localized onset, not intractable, without status epilepticus: Secondary | ICD-10-CM | POA: Diagnosis not present

## 2018-02-03 MED ORDER — LEVETIRACETAM 500 MG PO TABS: ORAL_TABLET | ORAL | 3 refills | Status: DC

## 2018-02-03 NOTE — Patient Instructions (Signed)
Great seeing you! Continue Keppra 500mg : Take 3 tablets twice a day. Follow-up in 1 year, call for any changes.  Seizure Precautions: 1. If medication has been prescribed for you to prevent seizures, take it exactly as directed.  Do not stop taking the medicine without talking to your doctor first, even if you have not had a seizure in a long time.   2. Avoid activities in which a seizure would cause danger to yourself or to others.  Don't operate dangerous machinery, swim alone, or climb in high or dangerous places, such as on ladders, roofs, or girders.  Do not drive unless your doctor says you may.  3. If you have any warning that you may have a seizure, lay down in a safe place where you can't hurt yourself.    4.  No driving for 6 months from last seizure, as per [facility].   Please refer to the following link on the Epilepsy Foundation of America's website for more information: http://www.epilepsyfoundation.org/answerplace/Social/driving/drivingu.cfm   5.  Maintain good sleep hygiene. Avoid alcohol.  6.  Contact your doctor if you have any problems that may be related to the medicine you are taking.  7.  Call 911 and bring the patient back to the ED if:        A.  The seizure lasts longer than 5 minutes.       B.  The patient doesn't awaken shortly after the seizure  C.  The patient has new problems such as difficulty seeing, speaking or moving  D.  The patient was injured during the seizure  E.  The patient has a temperature over 102 F (39C)  F.  The patient vomited and now is having trouble breathing

## 2018-02-03 NOTE — Progress Notes (Signed)
NEUROLOGY FOLLOW UP OFFICE NOTE  ROLLY MAGRI [id number]  DOB: 01/25/1991  HISTORY OF PRESENT ILLNESS: I had the pleasure of seeing Purnell Daigle in follow-up in the neurology clinic on 02/03/2018.  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 He continues to be seizure-free since 2012. He was previously on brand Keppra until last year with insurance changes, and has been taking generic Levetiracetam 500mg  3 tabs twice a day with no issues. He denies any side effects on Levetiracetam. He denies his typical aura, no staring/unresponsive episodes, gaps in time, olfactory/gustatory hallucinations, focal numbness/tingling/weakness, myoclonic jerks. No headaches, dizziness, diplopia, no falls. 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   HPI 02/02/2015: This is a very pleasant 27 yo RH man with a history of seizures since age 31 and ADD. His last convulsion was in May 2012. He sometimes has an aura where he feels the seizure coming on, and could feel himself looking off to the left side, before progressing to a GTC. He recalls having episodes where he would feel himself looking to the left side, without going into a convulsion. Seizures usually occurred very early in the morning or as soon as he was waking up. He denies any post-ictal focal weakness, but usually has a headache, nausea, cannot taste anything, and a cramp in the right calf after a seizure. He reports having nocturnal seizures in the past, where he would wake up with these symptoms. Sleep deprivation may be a trigger to some of his seizures. He was brought to [facility] for the seizures, per patient he had brain imaging and an EEG, results unavailable for review. He reports being tried on unrecalled medications which did not help, until he was started on brand name Keppra, which has controlled his seizures for the past 3 years. He denies any side effects on Keppra 1500mg  BID.  He denies any further staring/unresponsive episodes, gaps in time, olfactory/gustatory hallucinations, deja vu, rising epigastric sensation, focal numbness/tingling/weakness, myoclonic jerks. He denies any headaches, dizziness, diplopia, dysarthria, dysphagia, neck/back pain, bowel/bladder dysfunction. He feels mood is pretty good. His memory is pretty good with certain things. He is driving. He drinks alcohol occasionally.  Epilepsy Risk Factors:  He had a normal birth and early development.  There is no history of febrile convulsions, CNS infections such as meningitis/encephalitis, significant traumatic brain injury, neurosurgical procedures, or family history of seizures.  PAST MEDICAL HISTORY: Past Medical History:  Diagnosis Date  . ADD (attention deficit disorder) 08/24/2012  . Convulsions/seizures (HCC) 08/24/2012  . Obesity, unspecified 08/24/2012  . Psoriasis 08/24/2012    MEDICATIONS: Current Outpatient Medications on File Prior to Visit  Medication Sig Dispense Refill  . levETIRAcetam (KEPPRA) 500 MG tablet Take 3 tablets twice a day 540 tablet 3  . methylphenidate 54 MG PO CR tablet Take 54 mg by mouth.     No current facility-administered medications on file prior to visit.     ALLERGIES: No Known Allergies  FAMILY HISTORY: Family History  Adopted: Yes  Family history unknown: Yes    SOCIAL HISTORY: Social History   Socioeconomic History  . Marital status: Single    Spouse name: Not on file  . Number of children: Not on file  . Years of education: Not on file  . Highest education level: Not on file  Occupational History  . Not on file  Social Needs  . Financial resource strain: Not on file  . Food  insecurity:    Worry: Not on file    Inability: Not on file  . Transportation needs:    Medical: Not on file    Non-medical: Not on file  Tobacco Use  . Smoking status: Never Smoker  . Smokeless tobacco: Never Used  Substance and Sexual Activity  . Alcohol  use: Yes    Alcohol/week: 0.0 standard drinks    Comment: occ  . Drug use: No  . Sexual activity: Not Currently  Lifestyle  . Physical activity:    Days per week: Not on file    Minutes per session: Not on file  . Stress: Not on file  Relationships  . Social connections:    Talks on phone: Not on file    Gets together: Not on file    Attends religious service: Not on file    Active member of club or organization: Not on file    Attends meetings of clubs or organizations: Not on file    Relationship status: Not on file  . Intimate partner violence:    Fear of current or ex partner: Not on file    Emotionally abused: Not on file    Physically abused: Not on file    Forced sexual activity: Not on file  Other Topics Concern  . Not on file  Social History Narrative  . Not on file    REVIEW OF SYSTEMS: Constitutional: No fevers, chills, or sweats, no generalized fatigue, change in appetite Eyes: No visual changes, double vision, eye pain Ear, nose and throat: No hearing loss, ear pain, nasal congestion, sore throat Cardiovascular: No chest pain, palpitations Respiratory:  No shortness of breath at rest or with exertion, wheezes GastrointestinaI: No nausea, vomiting, diarrhea, abdominal pain, fecal incontinence Genitourinary:  No dysuria, urinary retention or frequency Musculoskeletal:  No neck pain, back pain Integumentary: No rash, pruritus, skin lesions Neurological: as above Psychiatric: No depression, insomnia, anxiety Endocrine: No palpitations, fatigue, diaphoresis, mood swings, change in appetite, change in weight, increased thirst Hematologic/Lymphatic:  No anemia, purpura, petechiae. Allergic/Immunologic: no itchy/runny eyes, nasal congestion, recent allergic reactions, rashes  PHYSICAL EXAM: Vitals:   02/03/18 1019  BP: 110/84  Pulse: 96  SpO2: 96%   General: No acute distress Head:  Normocephalic/atraumatic Neck: supple, no paraspinal tenderness, full range  of motion Heart:  Regular rate and rhythm Lungs:  Clear to auscultation bilaterally Back: No paraspinal tenderness Skin/Extremities: No rash, no edema Neurological Exam: alert and oriented to person, place, and time. No aphasia or dysarthria. Fund of knowledge is appropriate.  Recent and remote memory are intact. 2/3 delayed recall.  Attention and concentration are normal.    Able to name objects and repeat phrases. Cranial nerves: Pupils equal, round, reactive to light.  Extraocular movements intact with no nystagmus. Visual fields full. Facial sensation intact. No facial asymmetry. Tongue, uvula, palate midline.  Motor: Bulk and tone normal, muscle strength 5/5 throughout with no pronator drift.  Sensation to light touch intact.  No extinction to double simultaneous stimulation.  Deep tendon reflexes +1 throughout, toes downgoing.  Finger to nose testing intact.  Gait narrow-based and steady, able to tandem walk adequately.    IMPRESSION: This is a pleasant 27 yo RH man with a history of seizures since age 69 suggestive of focal to bilateral tonic-clonic epilepsy possibly arising from the right hemisphere. He describes episodes where he can feel himself staring and looking to the left, these may or may not progress to a convulsion. Prior EEG/MRI  unavailable for review. He continues to do well with no seizures since May 2012 on Levetiracetam 500mg  3 tablets twice a day without side effects. We discussed avoidance of seizure triggers including missing medications, sleep deprivation, and alcohol. He is aware of [place] driving laws to stop driving after a seizure, until 6 months seizure-free. He will follow-up in 1 year and knows to call our office for any changes.   Thank you for allowing me to participate in his care.  Please do not hesitate to call for any questions or concerns.  The duration of this appointment visit was 15 minutes of face-to-face time with the patient.  Greater than 50% of this time was  spent in counseling, explanation of diagnosis, planning of further management, and coordination of care.   [name], M.D.   CC: Dr. Neita Carp

## 2018-02-10 ENCOUNTER — Other Ambulatory Visit: Payer: Self-pay | Admitting: Neurology

## 2018-02-10 DIAGNOSIS — G40009 Localization-related (focal) (partial) idiopathic epilepsy and epileptic syndromes with seizures of localized onset, not intractable, without status epilepticus: Secondary | ICD-10-CM

## 2018-06-21 ENCOUNTER — Other Ambulatory Visit: Payer: Self-pay | Admitting: Neurology

## 2018-07-19 ENCOUNTER — Other Ambulatory Visit: Payer: Self-pay | Admitting: Neurology

## 2018-08-06 ENCOUNTER — Other Ambulatory Visit: Payer: Self-pay | Admitting: Neurology

## 2018-08-12 ENCOUNTER — Other Ambulatory Visit: Payer: Self-pay

## 2018-08-12 ENCOUNTER — Telehealth: Payer: Self-pay | Admitting: Neurology

## 2018-08-12 MED ORDER — LEVETIRACETAM 500 MG PO TABS: 1500.0000 mg | ORAL_TABLET | Freq: Two times a day (BID) | ORAL | 1 refills | Status: DC

## 2018-08-12 NOTE — Telephone Encounter (Signed)
Patient called regarding his Generic Keppra medication. He said he is needing a refill. He uses [facility] in [place]. He is needing to pick it up today because he is leaving to go out of town in the Morning. Please Call. Thanks

## 2018-10-26 ENCOUNTER — Other Ambulatory Visit: Payer: Self-pay | Admitting: Neurology

## 2019-02-02 ENCOUNTER — Other Ambulatory Visit: Payer: Self-pay

## 2019-02-02 ENCOUNTER — Encounter: Payer: Self-pay | Admitting: Neurology

## 2019-02-02 ENCOUNTER — Ambulatory Visit (INDEPENDENT_AMBULATORY_CARE_PROVIDER_SITE_OTHER): Payer: 59 | Admitting: Neurology

## 2019-02-02 VITALS — BP 138/94 | HR 93 | Ht 72.0 in | Wt 266.1 lb

## 2019-02-02 DIAGNOSIS — G40009 Localization-related (focal) (partial) idiopathic epilepsy and epileptic syndromes with seizures of localized onset, not intractable, without status epilepticus: Secondary | ICD-10-CM | POA: Diagnosis not present

## 2019-02-02 MED ORDER — LEVETIRACETAM 500 MG PO TABS: 1500.0000 mg | ORAL_TABLET | Freq: Two times a day (BID) | ORAL | 3 refills | Status: DC

## 2019-02-02 NOTE — Progress Notes (Signed)
NEUROLOGY FOLLOW UP OFFICE NOTE  Chase Wheeler [id number]  DOB: Mar 06, 1991  HISTORY OF PRESENT ILLNESS: I had the pleasure of seeing Chase Wheeler in follow-up in the neurology clinic on 02/02/2019.  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 Since his last visit, he reports doing well, he has been seizure-free since 2012. He is taking Levetiracetam 500mg  3 tabs BID without side effects. He denies any staring/unresponsive episodes, gaps in time, olfactory/gustatory hallucinations, focal numbness/tingling/weakness, myoclonic jerks. No headaches, dizziness, vision changes, no falls. He injured his right knee 3 years ago and this occasionally bothers him. Sleep is good. 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   History on Initial Assessment 02/02/2015: This is a very pleasant 28 yo RH man with a history of seizures since age 21 and ADD. His last convulsion was in May 2012. He sometimes has an aura where he feels the seizure coming on, and could feel himself looking off to the left side, before progressing to a GTC. He recalls having episodes where he would feel himself looking to the left side, without going into a convulsion. Seizures usually occurred very early in the morning or as soon as he was waking up. He denies any post-ictal focal weakness, but usually has a headache, nausea, cannot taste anything, and a cramp in the right calf after a seizure. He reports having nocturnal seizures in the past, where he would wake up with these symptoms. Sleep deprivation may be a trigger to some of his seizures. He was brought to [facility] for the seizures, per patient he had brain imaging and an EEG, results unavailable for review. He reports being tried on unrecalled medications which did not help, until he was started on brand name Keppra, which has controlled his seizures for the past 3 years. He denies any  side effects on Keppra 1500mg  BID. He denies any further staring/unresponsive episodes, gaps in time, olfactory/gustatory hallucinations, deja vu, rising epigastric sensation, focal numbness/tingling/weakness, myoclonic jerks. He denies any headaches, dizziness, diplopia, dysarthria, dysphagia, neck/back pain, bowel/bladder dysfunction. He feels mood is pretty good. His memory is pretty good with certain things. He is driving. He drinks alcohol occasionally.  Epilepsy Risk Factors:  He had a normal birth and early development.  There is no history of febrile convulsions, CNS infections such as meningitis/encephalitis, significant traumatic brain injury, neurosurgical procedures, or family history of seizures.  PAST MEDICAL HISTORY: Past Medical History:  Diagnosis Date  . ADD (attention deficit disorder) 08/24/2012  . Convulsions/seizures (HCC) 08/24/2012  . Obesity, unspecified 08/24/2012  . Psoriasis 08/24/2012    MEDICATIONS: Current Outpatient Medications on File Prior to Visit  Medication Sig Dispense Refill  . levETIRAcetam (KEPPRA) 500 MG tablet TAKE 3 TABLETS BY MOUTH TWICE DAILY. 180 tablet 3  . methylphenidate 54 MG PO CR tablet Take 54 mg by mouth.     No current facility-administered medications on file prior to visit.     ALLERGIES: No Known Allergies  FAMILY HISTORY: Family History  Adopted: Yes  Family history unknown: Yes    SOCIAL HISTORY: Social History   Socioeconomic History  . Marital status: Single    Spouse name: Not on file  . Number of children: Not on file  . Years of education: Not on file  . Highest education level: Not on file  Occupational History  . Not on file  Social Needs  .  Financial resource strain: Not on file  . Food insecurity    Worry: Not on file    Inability: Not on file  . Transportation needs    Medical: Not on file    Non-medical: Not on file  Tobacco Use  . Smoking status: Never Smoker  . Smokeless tobacco: Never Used   Substance and Sexual Activity  . Alcohol use: Yes    Alcohol/week: 0.0 standard drinks    Comment: occ  . Drug use: No  . Sexual activity: Not Currently  Lifestyle  . Physical activity    Days per week: Not on file    Minutes per session: Not on file  . Stress: Not on file  Relationships  . Social Herbalist on phone: Not on file    Gets together: Not on file    Attends religious service: Not on file    Active member of club or organization: Not on file    Attends meetings of clubs or organizations: Not on file    Relationship status: Not on file  . Intimate partner violence    Fear of current or ex partner: Not on file    Emotionally abused: Not on file    Physically abused: Not on file    Forced sexual activity: Not on file  Other Topics Concern  . Not on file  Social History Narrative  . Not on file    REVIEW OF SYSTEMS: Constitutional: No fevers, chills, or sweats, no generalized fatigue, change in appetite Eyes: No visual changes, double vision, eye pain Ear, nose and throat: No hearing loss, ear pain, nasal congestion, sore throat Cardiovascular: No chest pain, palpitations Respiratory:  No shortness of breath at rest or with exertion, wheezes GastrointestinaI: No nausea, vomiting, diarrhea, abdominal pain, fecal incontinence Genitourinary:  No dysuria, urinary retention or frequency Musculoskeletal:  No neck pain, back pain Integumentary: No rash, pruritus, skin lesions Neurological: as above Psychiatric: No depression, insomnia, anxiety Endocrine: No palpitations, fatigue, diaphoresis, mood swings, change in appetite, change in weight, increased thirst Hematologic/Lymphatic:  No anemia, purpura, petechiae. Allergic/Immunologic: no itchy/runny eyes, nasal congestion, recent allergic reactions, rashes  PHYSICAL EXAM: Vitals:   02/02/19 1006  BP: (!) 138/94  Pulse: 93  SpO2: 96%   General: No acute distress Head:  Normocephalic/atraumatic  Skin/Extremities: No rash, no edema Neurological Exam: alert and oriented to person, place, and time. No aphasia or dysarthria. Fund of knowledge is appropriate.  Recent and remote memory are intact. Attention and concentration are normal.    Able to name objects and repeat phrases. Cranial nerves: Pupils equal, round, reactive to light.  Extraocular movements intact with no nystagmus. Visual fields full. Facial sensation intact. No facial asymmetry. Tongue, uvula, palate midline.  Motor: Bulk and tone normal, muscle strength 5/5 throughout with no pronator drift. Finger to nose testing intact.  Gait narrow-based and steady, able to tandem walk adequately.    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 He describes episodes where he can feel himself staring and looking to the left, these may or may not progress to a convulsion. Prior EEG/MRI unavailable for review. He has been seizure-free since May 2012 on Levetiracetam 500mg  3 tabs BID, refills sent. He is aware of [place] driving laws to stop driving after a seizure, until 6 months seizure-free. He will follow-up in 1 year  and knows to call our office for any changes.   Thank you for allowing me to participate in his care.  Please do not hesitate to call for any questions or concerns.   Patrcia DollyKaren Aquino, M.D.   CC: Dr. Neita CarpSasser

## 2019-02-02 NOTE — Patient Instructions (Addendum)
Great seeing you! Continue Keppra (Levetiracetam) 500mg  3 tabs twice a day. Follow-up in 1 year, call for any changes.  Seizure Precautions: 1. If medication has been prescribed for you to prevent seizures, take it exactly as directed.  Do not stop taking the medicine without talking to your doctor first, even if you have not had a seizure in a long time.   2. Avoid activities in which a seizure would cause danger to yourself or to others.  Don't operate dangerous machinery, swim alone, or climb in high or dangerous places, such as on ladders, roofs, or girders.  Do not drive unless your doctor says you may.  3. If you have any warning that you may have a seizure, lay down in a safe place where you can't hurt yourself.    4.  No driving for 6 months from last seizure, as per [facility].   Please refer to the following link on the [place] website for more information: http://www.epilepsyfoundation.org/answerplace/Social/driving/drivingu.cfm   5.  Maintain good sleep hygiene. Avoid alcohol.  6.  Contact your doctor if you have any problems that may be related to the medicine you are taking.  7.  Call 911 and bring the patient back to the ED if:        A.  The seizure lasts longer than 5 minutes.       B.  The patient doesn't awaken shortly after the seizure  C.  The patient has new problems such as difficulty seeing, speaking or moving  D.  The patient was injured during the seizure  E.  The patient has a temperature over 102 F (39C)  F.  The patient vomited and now is having trouble breathing

## 2019-02-15 ENCOUNTER — Other Ambulatory Visit: Payer: Self-pay | Admitting: Neurology

## 2019-11-01 ENCOUNTER — Telehealth: Payer: Self-pay | Admitting: Neurology

## 2019-11-01 NOTE — Telephone Encounter (Signed)
Patient's mother called in. The patient travels for work and was just sent to [place] for a job. He was not told ahead of time that this job would be at night. They are concerned with his seizures. He had been told that lights could be a trigger. They are wanting to see Dr. Karel Jarvis could write a letter stating he should not work at night and fax it to [phone] ATTN: Idalia Needle.

## 2019-11-15 ENCOUNTER — Encounter: Payer: Self-pay | Admitting: Neurology

## 2019-11-15 NOTE — Telephone Encounter (Signed)
Called and spoke to patients mother and informed her that the letter has been faxed. Patients mother confirmed that she has received the letter.

## 2019-11-15 NOTE — Telephone Encounter (Signed)
Done, pls fax, thanks 

## 2020-02-02 ENCOUNTER — Encounter: Payer: Self-pay | Admitting: Neurology

## 2020-02-02 ENCOUNTER — Ambulatory Visit: Payer: 59 | Admitting: Neurology

## 2020-02-02 ENCOUNTER — Other Ambulatory Visit: Payer: Self-pay

## 2020-02-02 VITALS — BP 141/93 | HR 94 | Ht 73.0 in | Wt 280.4 lb

## 2020-02-02 DIAGNOSIS — G40009 Localization-related (focal) (partial) idiopathic epilepsy and epileptic syndromes with seizures of localized onset, not intractable, without status epilepticus: Secondary | ICD-10-CM | POA: Diagnosis not present

## 2020-02-02 MED ORDER — LEVETIRACETAM 500 MG PO TABS: 1500.0000 mg | ORAL_TABLET | Freq: Two times a day (BID) | ORAL | 3 refills | Status: DC

## 2020-02-02 NOTE — Patient Instructions (Signed)
Always good to see you. Congratulations on your engagement! Continue Keppra 500mg : Take 3 tablets twice a day. Follow-up in 1 year, call for any changes.   Seizure Precautions: 1. If medication has been prescribed for you to prevent seizures, take it exactly as directed.  Do not stop taking the medicine without talking to your doctor first, even if you have not had a seizure in a long time.   2. Avoid activities in which a seizure would cause danger to yourself or to others.  Don't operate dangerous machinery, swim alone, or climb in high or dangerous places, such as on ladders, roofs, or girders.  Do not drive unless your doctor says you may.  3. If you have any warning that you may have a seizure, lay down in a safe place where you can't hurt yourself.    4.  No driving for 6 months from last seizure, as per [facility].   Please refer to the following link on the Epilepsy Foundation of America's website for more information: http://www.epilepsyfoundation.org/answerplace/Social/driving/drivingu.cfm   5.  Maintain good sleep hygiene. Avoid alcohol.  6.  Contact your doctor if you have any problems that may be related to the medicine you are taking.  7.  Call 911 and bring the patient back to the ED if:        A.  The seizure lasts longer than 5 minutes.       B.  The patient doesn't awaken shortly after the seizure  C.  The patient has new problems such as difficulty seeing, speaking or moving  D.  The patient was injured during the seizure  E.  The patient has a temperature over 102 F (39C)  F.  The patient vomited and now is having trouble breathing

## 2020-02-02 NOTE — Progress Notes (Signed)
NEUROLOGY FOLLOW UP OFFICE NOTE  Chase Wheeler [id number] Aug 01, 1990  HISTORY OF PRESENT ILLNESS: I had the pleasure of seeing Chase Wheeler in follow-up in the neurology clinic on 02/01/2020.  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 He continues to do well, seizure-free since 2012 on Levetiracetam 500mg  3 tabs BID without side effects. He denies any staring/unresponsive episodes, gaps in time, olfactory/gustatory hallucinations, focal numbness/tingling/weakness, myoclonic jerks. No headaches, dizziness, vision changes, no falls. Sleep and mood are good. He is happy to report that he recently bought a house and got engaged. 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    History on Initial Assessment 02/02/2015: This is a very pleasant 29 yo RH man with a history of seizures since age 13 and ADD. His last convulsion was in May 2012. He sometimes has an aura where he feels the seizure coming on, and could feel himself looking off to the left side, before progressing to a GTC. He recalls having episodes where he would feel himself looking to the left side, without going into a convulsion. Seizures usually occurred very early in the morning or as soon as he was waking up. He denies any post-ictal focal weakness, but usually has a headache, nausea, cannot taste anything, and a cramp in the right calf after a seizure. He reports having nocturnal seizures in the past, where he would wake up with these symptoms. Sleep deprivation may be a trigger to some of his seizures. He was brought to [facility] for the seizures, per patient he had brain imaging and an EEG, results unavailable for review. He reports being tried on unrecalled medications which did not help, until he was started on brand name Keppra, which has controlled his seizures for the past 3 years. He denies any side effects on Keppra 1500mg  BID. He denies any further  staring/unresponsive episodes, gaps in time, olfactory/gustatory hallucinations, deja vu, rising epigastric sensation, focal numbness/tingling/weakness, myoclonic jerks. He denies any headaches, dizziness, diplopia, dysarthria, dysphagia, neck/back pain, bowel/bladder dysfunction. He feels mood is pretty good. His memory is pretty good with certain things. He is driving. He drinks alcohol occasionally.  Epilepsy Risk Factors:  He had a normal birth and early development.  There is no history of febrile convulsions, CNS infections such as meningitis/encephalitis, significant traumatic brain injury, neurosurgical procedures, or family history of seizures.    PAST MEDICAL HISTORY: Past Medical History:  Diagnosis Date  . ADD (attention deficit disorder) 08/24/2012  . Convulsions/seizures (HCC) 08/24/2012  . Obesity, unspecified 08/24/2012  . Psoriasis 08/24/2012    MEDICATIONS: Current Outpatient Medications on File Prior to Visit  Medication Sig Dispense Refill  . Apremilast (OTEZLA) 30 MG TABS Take by mouth daily.    08/26/2012 levETIRAcetam (KEPPRA) 500 MG tablet Take 3 tablets (1,500 mg total) by mouth 2 (two) times daily. 540 tablet 3   No current facility-administered medications on file prior to visit.    ALLERGIES: No Known Allergies  FAMILY HISTORY: Family History  Adopted: Yes  Family history unknown: Yes    SOCIAL HISTORY: Social History   Socioeconomic History  . Marital status: Single    Spouse name: Not on file  . Number of children: Not on file  . Years of education: Not on file  . Highest education level: Not on file  Occupational History  . Not on file  Tobacco Use  . Smoking status: Never Smoker  .  Smokeless tobacco: Never Used  Vaping Use  . Vaping Use: Every day  Substance and Sexual Activity  . Alcohol use: Yes    Alcohol/week: 0.0 standard drinks    Comment: occ  . Drug use: No  . Sexual activity: Not Currently  Other Topics Concern  . Not on file    Social History Narrative   Right handed      Graduated HS      Lives in 2 story home   Social Determinants of Health   Financial Resource Strain:   . Difficulty of Paying Living Expenses: Not on file  Food Insecurity:   . Worried About Programme researcher, broadcasting/film/video in the Last Year: Not on file  . Ran Out of Food in the Last Year: Not on file  Transportation Needs:   . Lack of Transportation (Medical): Not on file  . Lack of Transportation (Non-Medical): Not on file  Physical Activity:   . Days of Exercise per Week: Not on file  . Minutes of Exercise per Session: Not on file  Stress:   . Feeling of Stress : Not on file  Social Connections:   . Frequency of Communication with Friends and Family: Not on file  . Frequency of Social Gatherings with Friends and Family: Not on file  . Attends Religious Services: Not on file  . Active Member of Clubs or Organizations: Not on file  . Attends Banker Meetings: Not on file  . Marital Status: Not on file  Intimate Partner Violence:   . Fear of Current or Ex-Partner: Not on file  . Emotionally Abused: Not on file  . Physically Abused: Not on file  . Sexually Abused: Not on file     PHYSICAL EXAM: Vitals:   02/02/20 1000  BP: (!) 141/93  Pulse: 94  SpO2: 98%   General: No acute distress Head:  Normocephalic/atraumatic Skin/Extremities: No rash, no edema Neurological Exam: alert and oriented to person, place, and time. No aphasia or dysarthria. Fund of knowledge is appropriate.  Recent and remote memory are intact.  Attention and concentration are normal.   Cranial nerves: Pupils equal, round. Extraocular movements intact with no nystagmus. Visual fields full.  No facial asymmetry.  Motor: Bulk and tone normal, muscle strength 5/5 throughout with no pronator drift.   Finger to nose testing intact.  Gait narrow-based and steady, able to tandem walk adequately.  Romberg negative.   IMPRESSION: This is a pleasant 29 yo RH man  with a history of seizures since age 44 suggestive of focal to bilateral tonic-clonic epilepsy possibly arising from the right hemisphere. He describes episodes where he can feel himself staring and looking to the left, these may or may not progress to a convulsion. Prior EEG/MRI unavailable for review. He continues to do well seizure-free since May 2021 on Levetiracetam 1500mg  BID (500mg  3 tabs BID) without side effects, refills sent. He is aware of [place] driving laws to stop driving after a seizure, until 6 months seizure-free. Follow-up in 1 year, he knows to call for any changes.    Thank you for allowing me to participate in his care.  Please do not hesitate to call for any questions or concerns.   , M.D.   CC: Dr. 

## 2020-04-16 ENCOUNTER — Other Ambulatory Visit: Payer: Self-pay

## 2020-04-16 ENCOUNTER — Other Ambulatory Visit: Payer: Self-pay | Admitting: Occupational Medicine

## 2020-04-16 ENCOUNTER — Ambulatory Visit: Payer: Self-pay

## 2020-04-16 DIAGNOSIS — Z0000 Encounter for general adult medical examination without abnormal findings: Secondary | ICD-10-CM

## 2021-02-01 ENCOUNTER — Ambulatory Visit: Payer: 59 | Admitting: Neurology

## 2021-02-22 ENCOUNTER — Encounter: Payer: Self-pay | Admitting: Neurology

## 2021-02-22 ENCOUNTER — Ambulatory Visit: Payer: BC Managed Care – PPO | Admitting: Neurology

## 2021-02-22 ENCOUNTER — Other Ambulatory Visit: Payer: Self-pay

## 2021-02-22 VITALS — BP 154/87 | HR 93 | Ht 72.0 in | Wt 290.4 lb

## 2021-02-22 DIAGNOSIS — G40009 Localization-related (focal) (partial) idiopathic epilepsy and epileptic syndromes with seizures of localized onset, not intractable, without status epilepticus: Secondary | ICD-10-CM

## 2021-02-22 MED ORDER — LEVETIRACETAM 500 MG PO TABS: 1500.0000 mg | ORAL_TABLET | Freq: Two times a day (BID) | ORAL | 3 refills | Status: DC

## 2021-02-22 NOTE — Patient Instructions (Signed)
Good to see you and hear the great news! Continue Keppra 500mg : Take 3 tablets twice a day. Follow-up in 1 year, call for any changes.   Seizure Precautions: 1. If medication has been prescribed for you to prevent seizures, take it exactly as directed.  Do not stop taking the medicine without talking to your doctor first, even if you have not had a seizure in a long time.   2. Avoid activities in which a seizure would cause danger to yourself or to others.  Don't operate dangerous machinery, swim alone, or climb in high or dangerous places, such as on ladders, roofs, or girders.  Do not drive unless your doctor says you may.  3. If you have any warning that you may have a seizure, lay down in a safe place where you can't hurt yourself.    4.  No driving for 6 months from last seizure, as per [facility].   Please refer to the following link on the Epilepsy Foundation of America's website for more information: http://www.epilepsyfoundation.org/answerplace/Social/driving/drivingu.cfm   5.  Maintain good sleep hygiene. Avoid alcohol.  6.  Contact your doctor if you have any problems that may be related to the medicine you are taking.  7.  Call 911 and bring the patient back to the ED if:        A.  The seizure lasts longer than 5 minutes.       B.  The patient doesn't awaken shortly after the seizure  C.  The patient has new problems such as difficulty seeing, speaking or moving  D.  The patient was injured during the seizure  E.  The patient has a temperature over 102 F (39C)  F.  The patient vomited and now is having trouble breathing

## 2021-02-22 NOTE — Progress Notes (Signed)
NEUROLOGY FOLLOW UP OFFICE NOTE  Chase Wheeler [id number] [birth year]  HISTORY OF PRESENT ILLNESS: I had the pleasure of seeing Chase Wheeler in follow-up in the neurology clinic on 02/22/2021.  The patient was last seen a year ago for well-controlled epilepsy. He has a history of focal to bilateral tonic-clonic epilepsy, seizure-free since 2012 on Levetiracetam 500mg  3 tabs BID without side effects. He denies any staring/unresponsive episodes, gaps in time, episodes where he feels himself looking off to the left side, olfactory/gustatory hallucinations, focal numbness/tingling/weakness, myoclonic jerks. No headaches, dizziness, vision changes, no falls. Sleep is good. Mood is about the same. Memory is pretty good. He recently got married a month ago and does not travel for work any longer, working at the .    History on Initial Assessment 02/02/2015: This is a very pleasant 30 yo RH man with a history of seizures since age 38 and ADD. His last convulsion was in May 2012. He sometimes has an aura where he feels the seizure coming on, and could feel himself looking off to the left side, before progressing to a GTC. He recalls having episodes where he would feel himself looking to the left side, without going into a convulsion. Seizures usually occurred very early in the morning or as soon as he was waking up. He denies any post-ictal focal weakness, but usually has a headache, nausea, cannot taste anything, and a cramp in the right calf after a seizure. He reports having nocturnal seizures in the past, where he would wake up with these symptoms. Sleep deprivation may be a trigger to some of his seizures. He was brought to [facility] for the seizures, per patient he had brain imaging and an EEG, results unavailable for review. He reports being tried on unrecalled medications which did not help, until he was started on brand name Keppra, which has controlled his seizures for the  past 3 years. He denies any side effects on Keppra 1500mg  BID. He denies any further staring/unresponsive episodes, gaps in time, olfactory/gustatory hallucinations, deja vu, rising epigastric sensation, focal numbness/tingling/weakness, myoclonic jerks. He denies any headaches, dizziness, diplopia, dysarthria, dysphagia, neck/back pain, bowel/bladder dysfunction. He feels mood is pretty good. His memory is pretty good with certain things. He is driving. He drinks alcohol occasionally.   Epilepsy Risk Factors:  He had a normal birth and early development.  There is no history of febrile convulsions, CNS infections such as meningitis/encephalitis, significant traumatic brain injury, neurosurgical procedures, or family history of seizures.     PAST MEDICAL HISTORY: Past Medical History:  Diagnosis Date   ADD (attention deficit disorder) 08/24/2012   Convulsions/seizures (HCC) 08/24/2012   Obesity, unspecified 08/24/2012   Psoriasis 08/24/2012    MEDICATIONS: Current Outpatient Medications on File Prior to Visit  Medication Sig Dispense Refill   Apremilast (OTEZLA) 30 MG TABS Take by mouth daily.     levETIRAcetam (KEPPRA) 500 MG tablet Take 3 tablets (1,500 mg total) by mouth 2 (two) times daily. 540 tablet 3   No current facility-administered medications on file prior to visit.    ALLERGIES: No Known Allergies  FAMILY HISTORY: Family History  Adopted: Yes  Family history unknown: Yes    SOCIAL HISTORY: Social History   Socioeconomic History   Marital status: Single    Spouse name: Not on file   Number of children: Not on file   Years of education: Not on file   Highest education level: Not on file  Occupational History  Not on file  Tobacco Use   Smoking status: Never   Smokeless tobacco: Never  Vaping Use   Vaping Use: Every day  Substance and Sexual Activity   Alcohol use: Yes    Alcohol/week: 0.0 standard drinks    Comment: occ   Drug use: No   Sexual activity:  Not Currently  Other Topics Concern   Not on file  Social History Narrative   Right handed      Graduated HS      Lives in 2 story home   Social Determinants of Health   Financial Resource Strain: Not on file  Food Insecurity: Not on file  Transportation Needs: Not on file  Physical Activity: Not on file  Stress: Not on file  Social Connections: Not on file  Intimate Partner Violence: Not on file     PHYSICAL EXAM: Vitals:   02/22/21 0830  BP: (!) 154/87  Pulse: 93  SpO2: 97%   General: No acute distress Head:  Normocephalic/atraumatic Skin/Extremities: No rash, no edema Neurological Exam: alert and oriented to person, place, and time. No aphasia or dysarthria. Fund of knowledge is appropriate.  Recent and remote memory are intact, 3/3 delayed recall.  Attention and concentration are normal, 5/5 WORLD backwards.  Cranial nerves: Pupils equal, round. Extraocular movements intact with no nystagmus. Visual fields full.  No facial asymmetry.  Motor: Bulk and tone normal, muscle strength 5/5 throughout with no pronator drift.   Finger to nose testing intact.  Gait narrow-based and steady, able to tandem walk adequately.  Romberg negative.   IMPRESSION: This is a pleasant 30 yo RH man with a history of seizures since age 89 suggestive of focal to bilateral tonic-clonic epilepsy possibly arising from the right hemisphere. He describes episodes where he can feel himself staring and looking to the left, these may or may not progress to a convulsion. Prior EEG/MRI unavailable for review. Seizures well-controlled on Levetiracetam 1500mg  BID (500mg  3 tabs BID), last seizure was in 2012. He is aware of [place] driving laws to stop driving after a seizure until 6 months seizure-free. Follow-up in 1 year, call for any changes.    Thank you for allowing me to participate in his care.  Please do not hesitate to call for any questions or concerns.   , M.D.   CC: Dr. 2013

## 2021-03-19 ENCOUNTER — Other Ambulatory Visit: Payer: Self-pay | Admitting: Neurology

## 2021-11-04 IMAGING — DX DG CHEST 1V
1 series · 1 of 1 positions shown · non-contrast
Comparison: 11/06/2015

CLINICAL DATA: 29-year-old male with x-ray for physical exam.
History of vaping

EXAM:
CHEST  1 VIEW

[chest pa]
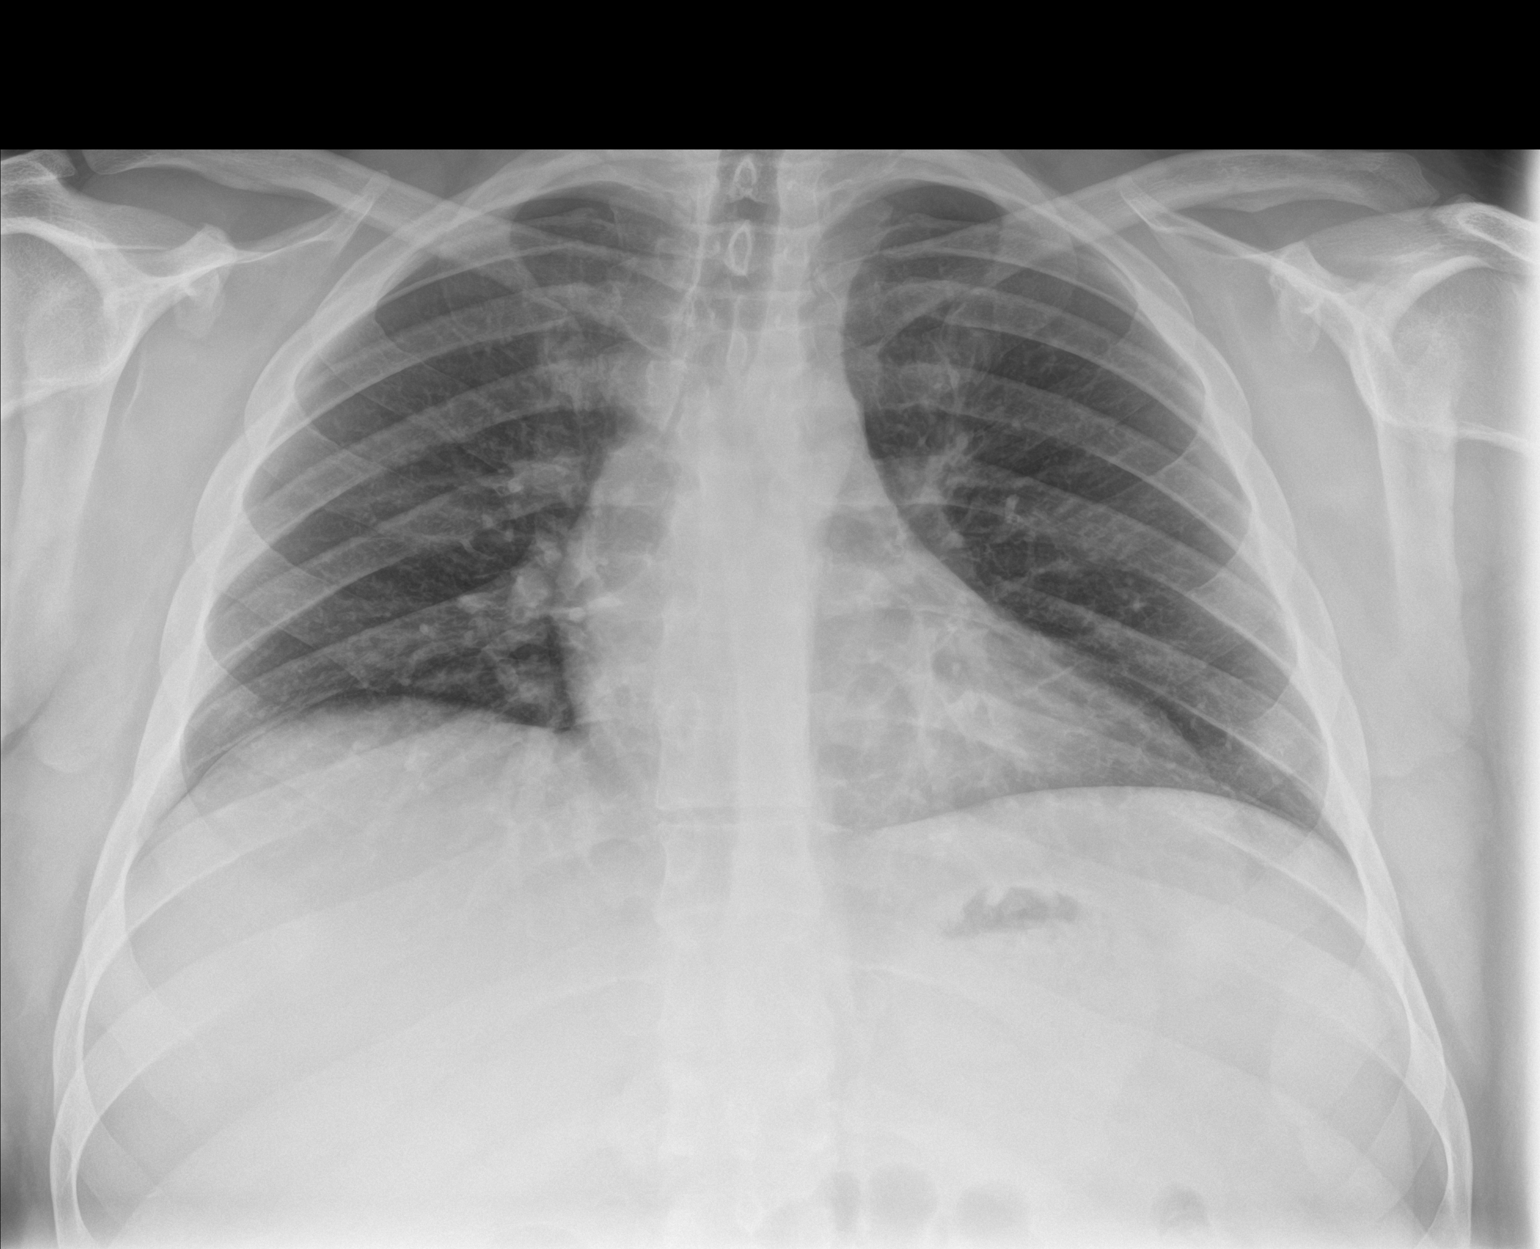

[1 of 1 positions shown; findings below may reference images not displayed]

FINDINGS: The heart size and mediastinal contours are within normal limits.
Low lung volumes without confluent airspace disease. The visualized
skeletal structures are unremarkable.
IMPRESSION: No active disease.

## 2022-02-20 ENCOUNTER — Ambulatory Visit: Payer: BC Managed Care – PPO | Admitting: Neurology

## 2022-02-20 ENCOUNTER — Encounter: Payer: Self-pay | Admitting: Neurology

## 2022-02-20 VITALS — BP 135/82 | HR 88 | Ht 72.0 in | Wt 249.8 lb

## 2022-02-20 DIAGNOSIS — G40009 Localization-related (focal) (partial) idiopathic epilepsy and epileptic syndromes with seizures of localized onset, not intractable, without status epilepticus: Secondary | ICD-10-CM | POA: Diagnosis not present

## 2022-02-20 MED ORDER — LEVETIRACETAM 500 MG PO TABS: 1500.0000 mg | ORAL_TABLET | Freq: Two times a day (BID) | ORAL | 11 refills | Status: DC

## 2022-02-20 NOTE — Patient Instructions (Signed)
Always a pleasure to see you. Continue Levetiracetam 500mg : take 3 tablets twice a day. Follow-up in 1 year, call for any changes.    Seizure Precautions: 1. If medication has been prescribed for you to prevent seizures, take it exactly as directed.  Do not stop taking the medicine without talking to your doctor first, even if you have not had a seizure in a long time.   2. Avoid activities in which a seizure would cause danger to yourself or to others.  Don't operate dangerous machinery, swim alone, or climb in high or dangerous places, such as on ladders, roofs, or girders.  Do not drive unless your doctor says you may.  3. If you have any warning that you may have a seizure, lay down in a safe place where you can't hurt yourself.    4.  No driving for 6 months from last seizure, as per [facility].   Please refer to the following link on the Epilepsy Foundation of America's website for more information: http://www.epilepsyfoundation.org/answerplace/Social/driving/drivingu.cfm   5.  Maintain good sleep hygiene. Avoid alcohol.  6.  Contact your doctor if you have any problems that may be related to the medicine you are taking.  7.  Call 911 and bring the patient back to the ED if:        A.  The seizure lasts longer than 5 minutes.       B.  The patient doesn't awaken shortly after the seizure  C.  The patient has new problems such as difficulty seeing, speaking or moving  D.  The patient was injured during the seizure  E.  The patient has a temperature over 102 F (39C)  F.  The patient vomited and now is having trouble breathing

## 2022-02-20 NOTE — Progress Notes (Signed)
NEUROLOGY FOLLOW UP OFFICE NOTE  HAVARD RADIGAN [id number] Mar 08, 1991  HISTORY OF PRESENT ILLNESS: I had the pleasure of seeing Chase Wheeler in follow-up in the neurology clinic on 02/20/2022.  The patient was last seen a year ago for well-controlled epilepsy. He has a history of focal to bilateral tonic-clonic epilepsy, seizure-free since 2012 on Levetiracetam 500mg  3 tabs BID without side effects. He denies any staring/unresponsive episodes, gaps in time, olfactory/gustatory hallucinations, focal numbness/tingling/weakness. He injured his left hand/4th digit and sometimes it would twitch. He gets good sleep. Mood is "eh." He works at a .    History on Initial Assessment 02/02/2015: This is a very pleasant 31 yo RH man with a history of seizures since age 31 and ADD. His last convulsion was in May 2012. He sometimes has an aura where he feels the seizure coming on, and could feel himself looking off to the left side, before progressing to a GTC. He recalls having episodes where he would feel himself looking to the left side, without going into a convulsion. Seizures usually occurred very early in the morning or as soon as he was waking up. He denies any post-ictal focal weakness, but usually has a headache, nausea, cannot taste anything, and a cramp in the right calf after a seizure. He reports having nocturnal seizures in the past, where he would wake up with these symptoms. Sleep deprivation may be a trigger to some of his seizures. He was brought to [facility] for the seizures, per patient he had brain imaging and an EEG, results unavailable for review. He reports being tried on unrecalled medications which did not help, until he was started on brand name Keppra, which has controlled his seizures for the past 3 years. He denies any side effects on Keppra 1500mg  BID. He denies any further staring/unresponsive episodes, gaps in time, olfactory/gustatory hallucinations, deja  vu, rising epigastric sensation, focal numbness/tingling/weakness, myoclonic jerks. He denies any headaches, dizziness, diplopia, dysarthria, dysphagia, neck/back pain, bowel/bladder dysfunction. He feels mood is pretty good. His memory is pretty good with certain things. He is driving. He drinks alcohol occasionally.   Epilepsy Risk Factors:  He had a normal birth and early development.  There is no history of febrile convulsions, CNS infections such as meningitis/encephalitis, significant traumatic brain injury, neurosurgical procedures, or family history of seizures.     PAST MEDICAL HISTORY: Past Medical History:  Diagnosis Date   ADD (attention deficit disorder) 08/24/2012   Convulsions/seizures (HCC) 08/24/2012   Obesity, unspecified 08/24/2012   Psoriasis 08/24/2012    MEDICATIONS: Current Outpatient Medications on File Prior to Visit  Medication Sig Dispense Refill   Apremilast (OTEZLA) 30 MG TABS Take by mouth daily.     levETIRAcetam (KEPPRA) 500 MG tablet TAKE 3 TABLETS BY MOUTH TWICE DAILY. 180 tablet 0   lisinopril (ZESTRIL) 5 MG tablet Take 5 mg by mouth daily.     metFORMIN (GLUCOPHAGE-XR) 500 MG 24 hr tablet Take 500 mg by mouth 3 (three) times daily.     OZEMPIC, 0.25 OR 0.5 MG/DOSE, 2 MG/3ML SOPN Inject into the skin.     SKYRIZI PEN 150 MG/ML SOAJ Inject 1 Pen into the skin. Every 12 weeks     No current facility-administered medications on file prior to visit.    ALLERGIES: No Known Allergies  FAMILY HISTORY: Family History  Adopted: Yes  Family history unknown: Yes    SOCIAL HISTORY: Social History   Socioeconomic History   Marital status:  Single    Spouse name: Not on file   Number of children: Not on file   Years of education: Not on file   Highest education level: Not on file  Occupational History   Not on file  Tobacco Use   Smoking status: Never   Smokeless tobacco: Never  Vaping Use   Vaping Use: Former  Substance and Sexual Activity    Alcohol use: Yes    Alcohol/week: 0.0 standard drinks of alcohol    Comment: occ   Drug use: No   Sexual activity: Not Currently  Other Topics Concern   Not on file  Social History Narrative   Right handed      Graduated HS      Lives in 2 story home   Social Determinants of Health   Financial Resource Strain: Not on file  Food Insecurity: Not on file  Transportation Needs: Not on file  Physical Activity: Not on file  Stress: Not on file  Social Connections: Not on file  Intimate Partner Violence: Not on file     PHYSICAL EXAM: Vitals:   02/20/22 1033  BP: 135/82  Pulse: 88  SpO2: 100%   General: No acute distress Head:  Normocephalic/atraumatic Skin/Extremities: No rash, no edema Neurological Exam: alert and awake. No aphasia or dysarthria. Fund of knowledge is appropriate.  Attention and concentration are normal.   Cranial nerves: Pupils equal, round. Extraocular movements intact with no nystagmus. Visual fields full.  No facial asymmetry.  Motor: Bulk and tone normal, muscle strength 5/5 throughout with no pronator drift.   Finger to nose testing intact.  Gait narrow-based and steady, able to tandem walk adequately.  Romberg negative.   IMPRESSION: This is a pleasant 31 yo RH man with a history of seizures since age 31 suggestive of focal to bilateral tonic-clonic epilepsy possibly arising from the right hemisphere. He describes episodes where he can feel himself staring and looking to the left, these may or may not progress to a convulsion. Prior EEG/MRI unavailable for review. He continues to do well seizure-free since 2012 on Levetiracetam 1500mg  BID (300mg  3 tabs BID), refills sent. He is aware of [place] driving laws to stop driving after a seizure until 6 months seizure-free. Follow-up in 1 year, call for any changes.    Thank you for allowing me to participate in his care.  Please do not hesitate to call for any questions or concerns.   , M.D.   CC:  Dr. 

## 2022-02-24 ENCOUNTER — Ambulatory Visit: Payer: BC Managed Care – PPO | Admitting: Neurology

## 2022-04-03 ENCOUNTER — Other Ambulatory Visit: Payer: Self-pay | Admitting: Neurology

## 2022-06-27 ENCOUNTER — Encounter: Payer: Self-pay | Admitting: Neurology

## 2022-09-23 ENCOUNTER — Other Ambulatory Visit: Payer: Self-pay | Admitting: Neurology

## 2022-11-26 ENCOUNTER — Other Ambulatory Visit: Payer: Self-pay | Admitting: Neurology

## 2023-01-02 ENCOUNTER — Other Ambulatory Visit: Payer: Self-pay | Admitting: Neurology

## 2023-01-02 MED ORDER — LEVETIRACETAM 500 MG PO TABS: 1500.0000 mg | ORAL_TABLET | Freq: Two times a day (BID) | ORAL | 1 refills | Status: DC

## 2023-01-05 ENCOUNTER — Telehealth: Payer: Self-pay | Admitting: Neurology

## 2023-01-05 MED ORDER — LEVETIRACETAM 500 MG PO TABS: 1500.0000 mg | ORAL_TABLET | Freq: Two times a day (BID) | ORAL | 1 refills | Status: DC

## 2023-01-05 NOTE — Telephone Encounter (Signed)
Chase Wheeler with [facility] called in and left a message with the access nurse on 01/02/23. Chase Wheeler is a new pt with them, requesting refill of his Keppra but needs an order for it. States has enough for 2 days - not sure if it will be enough for dosing if needs to fill Monday.   Full access nurse report is in Dr. Rosalyn Gess box

## 2023-01-05 NOTE — Telephone Encounter (Signed)
New Rx sent to [facility] pharmacy in [place]

## 2023-02-09 ENCOUNTER — Ambulatory Visit: Payer: BC Managed Care – PPO | Admitting: Neurology

## 2023-02-11 ENCOUNTER — Ambulatory Visit: Payer: BC Managed Care – PPO | Admitting: Neurology

## 2023-02-11 ENCOUNTER — Encounter: Payer: Self-pay | Admitting: Neurology

## 2023-02-11 VITALS — BP 139/83 | HR 91 | Ht 71.0 in | Wt 245.2 lb

## 2023-02-11 DIAGNOSIS — G40009 Localization-related (focal) (partial) idiopathic epilepsy and epileptic syndromes with seizures of localized onset, not intractable, without status epilepticus: Secondary | ICD-10-CM | POA: Diagnosis not present

## 2023-02-11 MED ORDER — LEVETIRACETAM 500 MG PO TABS: 1500.0000 mg | ORAL_TABLET | Freq: Two times a day (BID) | ORAL | 3 refills | Status: DC

## 2023-02-11 NOTE — Progress Notes (Signed)
NEUROLOGY FOLLOW UP OFFICE NOTE  Chase Wheeler [id number] 1990/07/02  HISTORY OF PRESENT ILLNESS: I had the pleasure of seeing Chase Wheeler in follow-up in the neurology clinic on 02/11/2023.  The patient was last seen a year ago for well-controlled epilepsy. He has a history of focal to bilateral tonic-clonic epilepsy and has been seizure-free since 2012 on Levetiracetam 500mg  3 tabs BID (1500mg  BID) without side effects. He denies any staring/unresponsive episodes, gaps in time, olfactory/gustatory hallucinations, focal numbness/tingling/weakness, myoclonic jerks. He has some twitches from old injuries, no change. No headaches, dizziness, vision changes, no falls. He gets 6-8 hours of sleep. No significant mood issues. He lives with his wife. He works at a [employer] and is driving.    History on Initial Assessment 02/02/2015: This is a very pleasant 32 yo RH man with a history of seizures since age 22 and ADD. His last convulsion was in May 2012. He sometimes has an aura where he feels the seizure coming on, and could feel himself looking off to the left side, before progressing to a GTC. He recalls having episodes where he would feel himself looking to the left side, without going into a convulsion. Seizures usually occurred very early in the morning or as soon as he was waking up. He denies any post-ictal focal weakness, but usually has a headache, nausea, cannot taste anything, and a cramp in the right calf after a seizure. He reports having nocturnal seizures in the past, where he would wake up with these symptoms. Sleep deprivation may be a trigger to some of his seizures. He was brought to [facility] for the seizures, per patient he had brain imaging and an EEG, results unavailable for review. He reports being tried on unrecalled medications which did not help, until he was started on brand name Keppra, which has controlled his seizures for the past 3 years. He denies any side  effects on Keppra 1500mg  BID. He denies any further staring/unresponsive episodes, gaps in time, olfactory/gustatory hallucinations, deja vu, rising epigastric sensation, focal numbness/tingling/weakness, myoclonic jerks. He denies any headaches, dizziness, diplopia, dysarthria, dysphagia, neck/back pain, bowel/bladder dysfunction. He feels mood is pretty good. His memory is pretty good with certain things. He is driving. He drinks alcohol occasionally.   Epilepsy Risk Factors:  He had a normal birth and early development.  There is no history of febrile convulsions, CNS infections such as meningitis/encephalitis, significant traumatic brain injury, neurosurgical procedures, or family history of seizures.    PAST MEDICAL HISTORY: Past Medical History:  Diagnosis Date   ADD (attention deficit disorder) 08/24/2012   Convulsions/seizures (HCC) 08/24/2012   Obesity, unspecified 08/24/2012   Psoriasis 08/24/2012    MEDICATIONS: Current Outpatient Medications on File Prior to Visit  Medication Sig Dispense Refill   Apremilast (OTEZLA) 30 MG TABS Take by mouth daily.     lisinopril (ZESTRIL) 5 MG tablet Take 5 mg by mouth daily.     metFORMIN (GLUCOPHAGE-XR) 500 MG 24 hr tablet Take 500 mg by mouth 3 (three) times daily.     SKYRIZI PEN 150 MG/ML SOAJ Inject 1 Pen into the skin. Every 12 weeks     No current facility-administered medications on file prior to visit.    ALLERGIES: No Known Allergies  FAMILY HISTORY: Family History  Adopted: Yes  Family history unknown: Yes    SOCIAL HISTORY: Social History   Socioeconomic History   Marital status: Single    Spouse name: Not on file  Number of children: Not on file   Years of education: Not on file   Highest education level: Not on file  Occupational History   Not on file  Tobacco Use   Smoking status: Never   Smokeless tobacco: Never  Vaping Use   Vaping status: Former  Substance and Sexual Activity   Alcohol use: Yes     Alcohol/week: 0.0 standard drinks of alcohol    Comment: occ   Drug use: No   Sexual activity: Not Currently  Other Topics Concern   Not on file  Social History Narrative   Right handed      Graduated HS      Lives in 2 story home   Social Determinants of Health   Financial Resource Strain: Not on file  Food Insecurity: Not on file  Transportation Needs: Not on file  Physical Activity: Not on file  Stress: Not on file  Social Connections: Not on file  Intimate Partner Violence: Not on file     PHYSICAL EXAM: Vitals:   02/11/23 0824  BP: 139/83  Pulse: 91  SpO2: 97%   General: No acute distress Head:  Normocephalic/atraumatic Skin/Extremities: No rash, no edema Neurological Exam: alert and awake. No aphasia or dysarthria. Fund of knowledge is appropriate.  Attention and concentration are normal.   Cranial nerves: Pupils equal, round. Extraocular movements intact with no nystagmus. Visual fields full.  No facial asymmetry.  Motor: Bulk and tone normal, muscle strength 5/5 throughout with no pronator drift.   Finger to nose testing intact.  Gait narrow-based and steady, able to tandem walk adequately.  Romberg negative.   IMPRESSION: This is a pleasant 32 yo RH man with a history of seizures since age 32 suggestive of focal to bilateral tonic-clonic epilepsy possibly arising from the right hemisphere. He described episodes where he can feel himself staring and looking to the left, these may or may not progress to a convulsion. Prior EEG/MRI unavailable for review. He remains seizure-free since 2012 on Levetiracetam 1500mg  BID (500mg  3 tabs BID), refills sent. We again discussed avoidance of seizure triggers. He is aware of  driving laws to stop driving after a seizure until 6 months seizure-free. Follow-up in 1 year, call for any changes.    Thank you for allowing me to participate in his care.  Please do not hesitate to call for any questions or concerns.    [name], M.D.   CC: Dr. Neita Carp

## 2023-02-11 NOTE — Patient Instructions (Signed)
Always a pleasure to see you. Continue Keppra (Levetiracetam) 500mg : take 3 tablets twice a day. Follow-up in 1 year, call for any changes.    Seizure Precautions: 1. If medication has been prescribed for you to prevent seizures, take it exactly as directed.  Do not stop taking the medicine without talking to your doctor first, even if you have not had a seizure in a long time.   2. Avoid activities in which a seizure would cause danger to yourself or to others.  Don't operate dangerous machinery, swim alone, or climb in high or dangerous places, such as on ladders, roofs, or girders.  Do not drive unless your doctor says you may.  3. If you have any warning that you may have a seizure, lay down in a safe place where you can't hurt yourself.    4.  No driving for 6 months from last seizure, as per [facility].   Please refer to the following link on the Epile[employer] Foundation of [employer]'s website for more information: http://www.epilepsyfoundation.org/answerplace/Social/driving/drivingu.cfm   5.  Maintain good sleep hygiene. Avoid alcohol.  6.  Contact your doctor if you have any problems that may be related to the medicine you are taking.  7.  Call 911 and bring the patient back to the ED if:        A.  The seizure lasts longer than 5 minutes.       B.  The patient doesn't awaken shortly after the seizure  C.  The patient has new problems such as difficulty seeing, speaking or moving  D.  The patient was injured during the seizure  E.  The patient has a temperature over 102 F (39C)  F.  The patient vomited and now is having trouble breathing

## 2024-02-02 ENCOUNTER — Encounter: Payer: Self-pay | Admitting: Neurology

## 2024-02-02 ENCOUNTER — Ambulatory Visit (INDEPENDENT_AMBULATORY_CARE_PROVIDER_SITE_OTHER): Payer: BC Managed Care – PPO | Admitting: Neurology

## 2024-02-02 VITALS — BP 130/85 | HR 106 | Ht 71.0 in | Wt 223.4 lb

## 2024-02-02 DIAGNOSIS — G40009 Localization-related (focal) (partial) idiopathic epilepsy and epileptic syndromes with seizures of localized onset, not intractable, without status epilepticus: Secondary | ICD-10-CM

## 2024-02-02 MED ORDER — LEVETIRACETAM 500 MG PO TABS: 1500.0000 mg | ORAL_TABLET | Freq: Two times a day (BID) | ORAL | 4 refills | Status: AC

## 2024-02-02 NOTE — Patient Instructions (Signed)
 It's always a pleasure to see you. Continue Keppra  (Levetiracetam ) 500mg : take 3 tablets twice a day. Follow-up in 1 year, call for any changes.    Seizure Precautions: 1. If medication has been prescribed for you to prevent seizures, take it exactly as directed.  Do not stop taking the medicine without talking to your doctor first, even if you have not had a seizure in a long time.   2. Avoid activities in which a seizure would cause danger to yourself or to others.  Don't operate dangerous machinery, swim alone, or climb in high or dangerous places, such as on ladders, roofs, or girders.  Do not drive unless your doctor says you may.  3. If you have any warning that you may have a seizure, lay down in a safe place where you can't hurt yourself.    4.  No driving for 6 months from last seizure, as per [place]  state law.   Please refer to the following link on the Epilepsy Foundation of America's website for more information: http://www.epilepsyfoundation.org/answerplace/Social/driving/drivingu.cfm   5.  Maintain good sleep hygiene. Avoid alcohol.  6.  Contact your doctor if you have any problems that may be related to the medicine you are taking.  7.  Call 911 and bring the patient back to the ED if:        A.  The seizure lasts longer than 5 minutes.       B.  The patient doesn't awaken shortly after the seizure  C.  The patient has new problems such as difficulty seeing, speaking or moving  D.  The patient was injured during the seizure  E.  The patient has a temperature over 102 F (39C)  F.  The patient vomited and now is having trouble breathing

## 2024-02-02 NOTE — Progress Notes (Signed)
 NEUROLOGY FOLLOW UP OFFICE NOTE  Chase Wheeler [id number] 01-31-1991  HISTORY OF PRESENT ILLNESS: I had the pleasure of seeing Chase Wheeler in follow-up in the neurology clinic on 02/02/2024.  The patient was last seen 31 years ago for well-controlled epilepsy. He is alone in the office today. He has a history of bilateral tonic-clonic epilepsy, seizure-free since 2012 on Levetiracetam  1500mg  BID (500mg : 3 tabs BID), no side effects. He denies any staring/unresponsive episodes, gaps in time, olfactory/gustatory hallucinations, focal numbness/tingling/weakness, myoclonic jerks. No headaches, dizziness, vision changes, no falls. He gets 5-6 hours of sleep. Mood is pretty good. He works at a [employer], [employer]. He lives with his wife. He has bloodwork with his new PCP this week.   History on Initial Assessment 02/02/2015: This is a 33 yo RH man with a history of seizures since age 33 and ADD. His last convulsion was in May 2012. He sometimes has an aura where he feels the seizure coming on, and could feel himself looking off to the left side, before progressing to a GTC. He recalls having episodes where he would feel himself looking to the left side, without going into a convulsion. Seizures usually occurred very early in the morning or as soon as he was waking up. He denies any post-ictal focal weakness, but usually has a headache, nausea, cannot taste anything, and a cramp in the right calf after a seizure. He reports having nocturnal seizures in the past, where he would wake up with these symptoms. Sleep deprivation may be a trigger to some of his seizures. He was brought to [facility] for the seizures, per patient he had brain imaging and an EEG, results unavailable for review. He reports being tried on unrecalled medications which did not help, until he was started on brand name Keppra , which has controlled his seizures for the past 3 years. He denies any side effects on Keppra  1500mg   BID. He denies any further staring/unresponsive episodes, gaps in time, olfactory/gustatory hallucinations, deja vu, rising epigastric sensation, focal numbness/tingling/weakness, myoclonic jerks. He denies any headaches, dizziness, diplopia, dysarthria, dysphagia, neck/back pain, bowel/bladder dysfunction. He feels mood is pretty good. His memory is pretty good with certain things. He is driving. He drinks alcohol occasionally.   Epilepsy Risk Factors:  He had a normal birth and early development.  There is no history of febrile convulsions, CNS infections such as meningitis/encephalitis, significant traumatic brain injury, neurosurgical procedures, or family history of seizures.    PAST MEDICAL HISTORY: Past Medical History:  Diagnosis Date   ADD (attention deficit disorder) 08/24/2012   Convulsions/seizures (HCC) 08/24/2012   Obesity, unspecified 08/24/2012   Psoriasis 08/24/2012    MEDICATIONS: Current Outpatient Medications on File Prior to Visit  Medication Sig Dispense Refill   levETIRAcetam  (KEPPRA ) 500 MG tablet Take 3 tablets (1,500 mg total) by mouth 2 (two) times daily. 540 tablet 3   lisinopril (ZESTRIL) 5 MG tablet Take 5 mg by mouth daily.     metFORMIN (GLUCOPHAGE-XR) 500 MG 24 hr tablet Take 500 mg by mouth 3 (three) times daily.     SKYRIZI PEN 150 MG/ML SOAJ Inject 1 Pen into the skin. Every 12 weeks     Apremilast (OTEZLA) 30 MG TABS Take by mouth daily.     No current facility-administered medications on file prior to visit.    ALLERGIES: No Known Allergies  FAMILY HISTORY: Family History  Adopted: Yes  Family history unknown: Yes    SOCIAL HISTORY: Social History  Socioeconomic History   Marital status: Single    Spouse name: Not on file   Number of children: Not on file   Years of education: Not on file   Highest education level: Not on file  Occupational History   Not on file  Tobacco Use   Smoking status: Never   Smokeless tobacco: Never  Vaping  Use   Vaping status: Former  Substance and Sexual Activity   Alcohol use: Yes    Alcohol/week: 0.0 standard drinks of alcohol    Comment: occ   Drug use: No   Sexual activity: Not Currently  Other Topics Concern   Not on file  Social History Narrative   Right handed      Graduated HS      Lives in 2 story home   Social Drivers of Health   Financial Resource Strain: Not on file  Food Insecurity: Not on file  Transportation Needs: Not on file  Physical Activity: Not on file  Stress: Not on file  Social Connections: Not on file  Intimate Partner Violence: Not on file     PHYSICAL EXAM: Vitals:   02/02/24 0810  BP: 130/85  Pulse: (!) 106  SpO2: 99%   General: No acute distress Head:  Normocephalic/atraumatic Skin/Extremities: No rash, no edema Neurological Exam: alert and awake. No aphasia or dysarthria. Fund of knowledge is appropriate.  Attention and concentration are normal.   Cranial nerves: Pupils equal, round. Extraocular movements intact with no nystagmus. Visual fields full.  No facial asymmetry.  Motor: Bulk and tone normal, muscle strength 5/5 throughout with no pronator drift.   Finger to nose testing intact.  Gait narrow-based and steady, able to tandem walk adequately.  Romberg negative.   IMPRESSION: This is a 33 yo RH man with a history of seizures since age 33 suggestive of focal to bilateral tonic-clonic epilepsy possibly arising from the right hemisphere. He described episodes where he can feel himself staring and looking to the left, these may or may not progress to a convulsion. Prior EEG/MRI unavailable for review. He continues to do well seizure-free since 2012 on Levetiracetam  1500mg  BID (500mg : 3 tabs BID), refills sent. We again discussed avoidance of seizure triggers. He is aware of [place] driving laws to stop driving after a seizure until 6 months seizure-free. Follow-up in 1 year, call for any changes.   Thank you for allowing me to  participate in his care.  Please do not hesitate to call for any questions or concerns.   Darice Shivers, M.D.   CC: Dr. Atilano

## 2024-02-23 ENCOUNTER — Encounter: Payer: Self-pay | Admitting: Neurology

## 2025-02-01 ENCOUNTER — Ambulatory Visit: Admitting: Neurology
